# Patient Record
Sex: Female | Born: 1940 | Race: White | Hispanic: No | Marital: Single | Smoking: Never smoker
Health system: Southern US, Community
[De-identification: ages and names within clinical notes are randomized; demographics above are authoritative.]

## PROBLEM LIST (undated history)

## (undated) DIAGNOSIS — I10 Essential (primary) hypertension: Secondary | ICD-10-CM

## (undated) HISTORY — PX: THYROIDECTOMY: SHX17

## (undated) HISTORY — PX: ABDOMINAL HYSTERECTOMY: SHX81

---

## 2019-11-01 ENCOUNTER — Ambulatory Visit: Payer: Self-pay

## 2020-05-20 ENCOUNTER — Other Ambulatory Visit: Payer: Self-pay | Admitting: Family Medicine

## 2020-05-20 DIAGNOSIS — Z13820 Encounter for screening for osteoporosis: Secondary | ICD-10-CM

## 2020-10-12 ENCOUNTER — Inpatient Hospital Stay (HOSPITAL_COMMUNITY): Payer: Medicare HMO | Admitting: Registered Nurse

## 2020-10-12 ENCOUNTER — Inpatient Hospital Stay (HOSPITAL_COMMUNITY): Payer: Medicare HMO

## 2020-10-12 ENCOUNTER — Inpatient Hospital Stay (HOSPITAL_COMMUNITY)
Admission: EM | Admit: 2020-10-12 | Discharge: 2020-10-15 | DRG: 481 | Disposition: A | Discharge status: Skilled Nursing Facility | Payer: Medicare HMO | Attending: Internal Medicine | Admitting: Internal Medicine

## 2020-10-12 ENCOUNTER — Encounter (HOSPITAL_COMMUNITY): Payer: Self-pay

## 2020-10-12 ENCOUNTER — Other Ambulatory Visit: Payer: Self-pay

## 2020-10-12 ENCOUNTER — Emergency Department (HOSPITAL_COMMUNITY): Payer: Medicare HMO

## 2020-10-12 ENCOUNTER — Encounter (HOSPITAL_COMMUNITY)
Admission: EM | Disposition: A | Discharge status: Skilled Nursing Facility | Payer: Self-pay | Source: Home / Self Care | Attending: Internal Medicine

## 2020-10-12 DIAGNOSIS — Z20822 Contact with and (suspected) exposure to covid-19: Secondary | ICD-10-CM | POA: Diagnosis present

## 2020-10-12 DIAGNOSIS — R0789 Other chest pain: Secondary | ICD-10-CM | POA: Diagnosis present

## 2020-10-12 DIAGNOSIS — E785 Hyperlipidemia, unspecified: Secondary | ICD-10-CM | POA: Diagnosis not present

## 2020-10-12 DIAGNOSIS — W19XXXA Unspecified fall, initial encounter: Secondary | ICD-10-CM

## 2020-10-12 DIAGNOSIS — I160 Hypertensive urgency: Secondary | ICD-10-CM | POA: Diagnosis present

## 2020-10-12 DIAGNOSIS — F419 Anxiety disorder, unspecified: Secondary | ICD-10-CM

## 2020-10-12 DIAGNOSIS — S72142A Displaced intertrochanteric fracture of left femur, initial encounter for closed fracture: Secondary | ICD-10-CM | POA: Diagnosis present

## 2020-10-12 DIAGNOSIS — E890 Postprocedural hypothyroidism: Secondary | ICD-10-CM | POA: Diagnosis present

## 2020-10-12 DIAGNOSIS — S72009A Fracture of unspecified part of neck of unspecified femur, initial encounter for closed fracture: Secondary | ICD-10-CM

## 2020-10-12 DIAGNOSIS — I10 Essential (primary) hypertension: Secondary | ICD-10-CM | POA: Diagnosis present

## 2020-10-12 DIAGNOSIS — W010XXA Fall on same level from slipping, tripping and stumbling without subsequent striking against object, initial encounter: Secondary | ICD-10-CM | POA: Diagnosis present

## 2020-10-12 DIAGNOSIS — D62 Acute posthemorrhagic anemia: Secondary | ICD-10-CM | POA: Diagnosis not present

## 2020-10-12 DIAGNOSIS — S72002A Fracture of unspecified part of neck of left femur, initial encounter for closed fracture: Secondary | ICD-10-CM | POA: Diagnosis not present

## 2020-10-12 HISTORY — DX: Essential (primary) hypertension: I10

## 2020-10-12 HISTORY — PX: FEMUR IM NAIL: SHX1597

## 2020-10-12 LAB — BASIC METABOLIC PANEL
Anion gap: 9 (ref 5–15)
BUN: 14 mg/dL (ref 8–23)
CO2: 24 mmol/L (ref 22–32)
Calcium: 9.1 mg/dL (ref 8.9–10.3)
Chloride: 105 mmol/L (ref 98–111)
Creatinine, Ser: 0.65 mg/dL (ref 0.44–1.00)
GFR, Estimated: >60 mL/min (ref >60)
Glucose, Bld: 173 mg/dL — ABNORMAL HIGH (ref 70–99)
Potassium: 3.9 mmol/L (ref 3.5–5.1)
Sodium: 138 mmol/L (ref 135–145)

## 2020-10-12 LAB — CBC WITH DIFFERENTIAL/PLATELET
Abs Immature Granulocytes: 0.02 10*3/uL (ref 0.00–0.07)
Basophils Absolute: 0.1 10*3/uL (ref 0.0–0.1)
Basophils Relative: 1 %
Eosinophils Absolute: 0 10*3/uL (ref 0.0–0.5)
Eosinophils Relative: 0 %
HCT: 41.9 % (ref 36.0–46.0)
Hemoglobin: 13.5 g/dL (ref 12.0–15.0)
Immature Granulocytes: 0 %
Lymphocytes Relative: 14 %
Lymphs Abs: 1.1 10*3/uL (ref 0.7–4.0)
MCH: 31.4 pg (ref 26.0–34.0)
MCHC: 32.2 g/dL (ref 30.0–36.0)
MCV: 97.4 fL (ref 80.0–100.0)
Monocytes Absolute: 0.3 10*3/uL (ref 0.1–1.0)
Monocytes Relative: 4 %
Neutro Abs: 6.8 10*3/uL (ref 1.7–7.7)
Neutrophils Relative %: 81 %
Platelets: 230 10*3/uL (ref 150–400)
RBC: 4.3 MIL/uL (ref 3.87–5.11)
RDW: 11.9 % (ref 11.5–15.5)
WBC: 8.3 10*3/uL (ref 4.0–10.5)
nRBC: 0 % (ref 0.0–0.2)

## 2020-10-12 LAB — RESPIRATORY PANEL BY RT PCR (FLU A&B, COVID)
Influenza A by PCR: NEGATIVE
Influenza B by PCR: NEGATIVE
SARS Coronavirus 2 by RT PCR: NEGATIVE

## 2020-10-12 LAB — TYPE AND SCREEN
ABO/RH(D): B NEG
Antibody Screen: NEGATIVE

## 2020-10-12 LAB — PROTIME-INR
INR: 1 (ref 0.8–1.2)
Prothrombin Time: 12.7 seconds (ref 11.4–15.2)

## 2020-10-12 LAB — ABO/RH: ABO/RH(D): B NEG

## 2020-10-12 LAB — MRSA PCR SCREENING: MRSA by PCR: NEGATIVE

## 2020-10-12 SURGERY — INSERTION, INTRAMEDULLARY ROD, FEMUR
Anesthesia: General | Site: Hip | Laterality: Left

## 2020-10-12 MED ORDER — DOCUSATE SODIUM 100 MG PO CAPS
100.0000 mg | ORAL_CAPSULE | Freq: Two times a day (BID) | ORAL | Status: DC
  Administered 2020-10-12 – 2020-10-15 (×6): 100 mg via ORAL
  Filled 2020-10-12 (×6): qty 1

## 2020-10-12 MED ORDER — ROCURONIUM BROMIDE 10 MG/ML (PF) SYRINGE
PREFILLED_SYRINGE | INTRAVENOUS | Status: AC
  Filled 2020-10-12: qty 10

## 2020-10-12 MED ORDER — SERTRALINE HCL 50 MG PO TABS: 50.0000 mg | ORAL_TABLET | Freq: Every day | ORAL | Status: DC

## 2020-10-12 MED ORDER — ONDANSETRON HCL 4 MG/2ML IJ SOLN
INTRAMUSCULAR | Status: AC
  Filled 2020-10-12: qty 2

## 2020-10-12 MED ORDER — SERTRALINE HCL 50 MG PO TABS
50.0000 mg | ORAL_TABLET | Freq: Every day | ORAL | Status: DC
  Administered 2020-10-13 – 2020-10-14 (×2): 50 mg via ORAL
  Filled 2020-10-12 (×2): qty 1

## 2020-10-12 MED ORDER — CEFAZOLIN SODIUM-DEXTROSE 2-4 GM/100ML-% IV SOLN
2.0000 g | Freq: Four times a day (QID) | INTRAVENOUS | Status: AC
  Administered 2020-10-12 – 2020-10-13 (×3): 2 g via INTRAVENOUS
  Filled 2020-10-12 (×3): qty 100

## 2020-10-12 MED ORDER — METOPROLOL TARTRATE 5 MG/5ML IV SOLN: 5.0000 mg | Freq: Three times a day (TID) | INTRAVENOUS | Status: DC | PRN

## 2020-10-12 MED ORDER — DEXAMETHASONE SODIUM PHOSPHATE 10 MG/ML IJ SOLN
INTRAMUSCULAR | Status: DC | PRN
  Administered 2020-10-12: 6 mg via INTRAVENOUS

## 2020-10-12 MED ORDER — AMLODIPINE BESYLATE 5 MG PO TABS: 5.0000 mg | ORAL_TABLET | Freq: Two times a day (BID) | ORAL | Status: DC

## 2020-10-12 MED ORDER — ACETAMINOPHEN 10 MG/ML IV SOLN
INTRAVENOUS | Status: DC | PRN
  Administered 2020-10-12: 1000 mg via INTRAVENOUS

## 2020-10-12 MED ORDER — SUCCINYLCHOLINE CHLORIDE 200 MG/10ML IV SOSY
PREFILLED_SYRINGE | INTRAVENOUS | Status: DC | PRN
  Administered 2020-10-12: 120 mg via INTRAVENOUS

## 2020-10-12 MED ORDER — LABETALOL HCL 5 MG/ML IV SOLN
INTRAVENOUS | Status: DC | PRN
  Administered 2020-10-12 (×2): 2.5 mg via INTRAVENOUS

## 2020-10-12 MED ORDER — SODIUM CHLORIDE 0.9 % IV SOLN: INTRAVENOUS | Status: DC

## 2020-10-12 MED ORDER — DEXAMETHASONE SODIUM PHOSPHATE 10 MG/ML IJ SOLN
INTRAMUSCULAR | Status: AC
  Filled 2020-10-12: qty 1

## 2020-10-12 MED ORDER — ALBUMIN HUMAN 5 % IV SOLN: INTRAVENOUS | Status: DC | PRN

## 2020-10-12 MED ORDER — METHOCARBAMOL 1000 MG/10ML IJ SOLN
500.0000 mg | Freq: Four times a day (QID) | INTRAVENOUS | Status: DC | PRN
  Filled 2020-10-12: qty 5

## 2020-10-12 MED ORDER — PROPOFOL 1000 MG/100ML IV EMUL
INTRAVENOUS | Status: AC
  Filled 2020-10-12: qty 100

## 2020-10-12 MED ORDER — AMLODIPINE BESYLATE 5 MG PO TABS
5.0000 mg | ORAL_TABLET | Freq: Two times a day (BID) | ORAL | Status: DC
  Administered 2020-10-13 – 2020-10-15 (×5): 5 mg via ORAL
  Filled 2020-10-12 (×5): qty 1

## 2020-10-12 MED ORDER — ENOXAPARIN SODIUM 40 MG/0.4ML ~~LOC~~ SOLN: 40.0000 mg | Freq: Every day | SUBCUTANEOUS | 0 refills | Status: DC

## 2020-10-12 MED ORDER — FENTANYL CITRATE (PF) 100 MCG/2ML IJ SOLN
INTRAMUSCULAR | Status: DC | PRN
  Administered 2020-10-12: 50 ug via INTRAVENOUS
  Administered 2020-10-12: 100 ug via INTRAVENOUS
  Administered 2020-10-12 (×2): 50 ug via INTRAVENOUS

## 2020-10-12 MED ORDER — ACETAMINOPHEN 10 MG/ML IV SOLN
INTRAVENOUS | Status: AC
  Filled 2020-10-12: qty 100

## 2020-10-12 MED ORDER — FENTANYL CITRATE (PF) 100 MCG/2ML IJ SOLN
50.0000 ug | Freq: Once | INTRAMUSCULAR | Status: DC
  Filled 2020-10-12: qty 2

## 2020-10-12 MED ORDER — OXYCODONE HCL 5 MG PO TABS
5.0000 mg | ORAL_TABLET | ORAL | Status: DC | PRN
  Administered 2020-10-13: 5 mg via ORAL
  Administered 2020-10-14 – 2020-10-15 (×2): 10 mg via ORAL
  Filled 2020-10-12 (×4): qty 2
  Filled 2020-10-12: qty 1
  Filled 2020-10-12 (×2): qty 2

## 2020-10-12 MED ORDER — CEFAZOLIN SODIUM-DEXTROSE 2-4 GM/100ML-% IV SOLN
2.0000 g | INTRAVENOUS | Status: AC
  Administered 2020-10-12: 2 g via INTRAVENOUS

## 2020-10-12 MED ORDER — SUGAMMADEX SODIUM 200 MG/2ML IV SOLN
INTRAVENOUS | Status: DC | PRN
  Administered 2020-10-12: 200 mg via INTRAVENOUS

## 2020-10-12 MED ORDER — EPHEDRINE 5 MG/ML INJ
INTRAVENOUS | Status: AC
  Filled 2020-10-12: qty 10

## 2020-10-12 MED ORDER — FENTANYL CITRATE (PF) 100 MCG/2ML IJ SOLN
50.0000 ug | INTRAMUSCULAR | Status: AC | PRN
  Administered 2020-10-12 (×2): 50 ug via INTRAVENOUS
  Filled 2020-10-12: qty 2

## 2020-10-12 MED ORDER — HYDROCODONE-ACETAMINOPHEN 5-325 MG PO TABS: 1.0000 | ORAL_TABLET | Freq: Four times a day (QID) | ORAL | Status: DC | PRN

## 2020-10-12 MED ORDER — METHOCARBAMOL 500 MG PO TABS
500.0000 mg | ORAL_TABLET | Freq: Four times a day (QID) | ORAL | Status: DC | PRN
  Administered 2020-10-12 – 2020-10-13 (×5): 500 mg via ORAL
  Filled 2020-10-12 (×5): qty 1

## 2020-10-12 MED ORDER — ONDANSETRON HCL 4 MG/2ML IJ SOLN
4.0000 mg | Freq: Three times a day (TID) | INTRAMUSCULAR | Status: DC | PRN
  Administered 2020-10-12: 4 mg via INTRAVENOUS
  Filled 2020-10-12: qty 2

## 2020-10-12 MED ORDER — PHENOL 1.4 % MT LIQD: 1.0000 | OROMUCOSAL | Status: DC | PRN

## 2020-10-12 MED ORDER — SUCCINYLCHOLINE CHLORIDE 200 MG/10ML IV SOSY
PREFILLED_SYRINGE | INTRAVENOUS | Status: AC
  Filled 2020-10-12: qty 10

## 2020-10-12 MED ORDER — TRANEXAMIC ACID-NACL 1000-0.7 MG/100ML-% IV SOLN
1000.0000 mg | INTRAVENOUS | Status: AC
  Administered 2020-10-12: 1000 mg via INTRAVENOUS

## 2020-10-12 MED ORDER — FENTANYL CITRATE (PF) 250 MCG/5ML IJ SOLN
INTRAMUSCULAR | Status: AC
  Filled 2020-10-12: qty 5

## 2020-10-12 MED ORDER — TRANEXAMIC ACID-NACL 1000-0.7 MG/100ML-% IV SOLN
INTRAVENOUS | Status: AC
  Filled 2020-10-12: qty 100

## 2020-10-12 MED ORDER — OXYCODONE-ACETAMINOPHEN 5-325 MG PO TABS: 1.0000 | ORAL_TABLET | Freq: Three times a day (TID) | ORAL | 0 refills | Status: DC | PRN

## 2020-10-12 MED ORDER — ATENOLOL 25 MG PO TABS: 25.0000 mg | ORAL_TABLET | Freq: Every day | ORAL | Status: DC

## 2020-10-12 MED ORDER — ENOXAPARIN SODIUM 40 MG/0.4ML ~~LOC~~ SOLN: 40.0000 mg | SUBCUTANEOUS | Status: DC

## 2020-10-12 MED ORDER — 0.9 % SODIUM CHLORIDE (POUR BTL) OPTIME
TOPICAL | Status: DC | PRN
  Administered 2020-10-12: 1000 mL

## 2020-10-12 MED ORDER — ATENOLOL 25 MG PO TABS
25.0000 mg | ORAL_TABLET | Freq: Every day | ORAL | Status: DC
  Administered 2020-10-13 – 2020-10-14 (×2): 25 mg via ORAL
  Filled 2020-10-12 (×2): qty 1

## 2020-10-12 MED ORDER — ONDANSETRON HCL 4 MG PO TABS: 4.0000 mg | ORAL_TABLET | Freq: Four times a day (QID) | ORAL | Status: DC | PRN

## 2020-10-12 MED ORDER — HYDROMORPHONE HCL 1 MG/ML IJ SOLN: 0.5000 mg | INTRAMUSCULAR | Status: DC | PRN

## 2020-10-12 MED ORDER — OXYCODONE HCL 5 MG PO TABS
10.0000 mg | ORAL_TABLET | ORAL | Status: DC | PRN
  Administered 2020-10-12 – 2020-10-14 (×5): 10 mg via ORAL
  Filled 2020-10-12: qty 2

## 2020-10-12 MED ORDER — ALUM & MAG HYDROXIDE-SIMETH 200-200-20 MG/5ML PO SUSP: 30.0000 mL | ORAL | Status: DC | PRN

## 2020-10-12 MED ORDER — ONE-DAILY MULTI VITAMINS PO TABS: 1.0000 | ORAL_TABLET | Freq: Every day | ORAL | Status: DC

## 2020-10-12 MED ORDER — ROCURONIUM BROMIDE 10 MG/ML (PF) SYRINGE
PREFILLED_SYRINGE | INTRAVENOUS | Status: DC | PRN
  Administered 2020-10-12: 30 mg via INTRAVENOUS

## 2020-10-12 MED ORDER — POLYETHYLENE GLYCOL 3350 17 G PO PACK: 17.0000 g | PACK | Freq: Every day | ORAL | Status: DC | PRN

## 2020-10-12 MED ORDER — CEFAZOLIN SODIUM-DEXTROSE 2-4 GM/100ML-% IV SOLN
INTRAVENOUS | Status: AC
  Filled 2020-10-12: qty 100

## 2020-10-12 MED ORDER — MENTHOL 3 MG MT LOZG: 1.0000 | LOZENGE | OROMUCOSAL | Status: DC | PRN

## 2020-10-12 MED ORDER — TRANEXAMIC ACID 1000 MG/10ML IV SOLN
2000.0000 mg | INTRAVENOUS | Status: DC
  Filled 2020-10-12: qty 20

## 2020-10-12 MED ORDER — ENOXAPARIN SODIUM 40 MG/0.4ML ~~LOC~~ SOLN
40.0000 mg | SUBCUTANEOUS | Status: DC
  Administered 2020-10-13 – 2020-10-15 (×3): 40 mg via SUBCUTANEOUS
  Filled 2020-10-12 (×3): qty 0.4

## 2020-10-12 MED ORDER — PROPOFOL 10 MG/ML IV BOLUS
INTRAVENOUS | Status: AC
  Filled 2020-10-12: qty 20

## 2020-10-12 MED ORDER — LIDOCAINE 2% (20 MG/ML) 5 ML SYRINGE
INTRAMUSCULAR | Status: AC
  Filled 2020-10-12: qty 5

## 2020-10-12 MED ORDER — LORAZEPAM 2 MG/ML IJ SOLN
0.5000 mg | Freq: Once | INTRAMUSCULAR | Status: AC
  Administered 2020-10-12: 0.5 mg via INTRAVENOUS
  Filled 2020-10-12: qty 1

## 2020-10-12 MED ORDER — LORAZEPAM 2 MG/ML IJ SOLN: 0.5000 mg | Freq: Two times a day (BID) | INTRAMUSCULAR | Status: DC | PRN

## 2020-10-12 MED ORDER — MIDAZOLAM HCL 2 MG/2ML IJ SOLN
INTRAMUSCULAR | Status: AC
  Filled 2020-10-12: qty 2

## 2020-10-12 MED ORDER — ACETAMINOPHEN 325 MG PO TABS
325.0000 mg | ORAL_TABLET | Freq: Four times a day (QID) | ORAL | Status: DC | PRN
  Administered 2020-10-13: 650 mg via ORAL
  Filled 2020-10-12: qty 2

## 2020-10-12 MED ORDER — LIDOCAINE 2% (20 MG/ML) 5 ML SYRINGE
INTRAMUSCULAR | Status: DC | PRN
  Administered 2020-10-12: 75 mg via INTRAVENOUS

## 2020-10-12 MED ORDER — SORBITOL 70 % SOLN
30.0000 mL | Freq: Every day | Status: DC | PRN
  Filled 2020-10-12: qty 30

## 2020-10-12 MED ORDER — ACETAMINOPHEN 500 MG PO TABS
1000.0000 mg | ORAL_TABLET | Freq: Four times a day (QID) | ORAL | Status: AC
  Administered 2020-10-12 – 2020-10-13 (×3): 1000 mg via ORAL
  Filled 2020-10-12 (×3): qty 2

## 2020-10-12 MED ORDER — ONDANSETRON HCL 4 MG/2ML IJ SOLN
INTRAMUSCULAR | Status: DC | PRN
  Administered 2020-10-12: 4 mg via INTRAVENOUS

## 2020-10-12 MED ORDER — MORPHINE SULFATE (PF) 2 MG/ML IV SOLN
0.5000 mg | INTRAVENOUS | Status: DC | PRN
  Administered 2020-10-12 (×2): 0.5 mg via INTRAVENOUS
  Filled 2020-10-12 (×2): qty 1

## 2020-10-12 MED ORDER — LACTATED RINGERS IV SOLN: INTRAVENOUS | Status: DC | PRN

## 2020-10-12 MED ORDER — MAGNESIUM CITRATE PO SOLN: 1.0000 | Freq: Once | ORAL | Status: DC | PRN

## 2020-10-12 MED ORDER — ONDANSETRON HCL 4 MG/2ML IJ SOLN: 4.0000 mg | Freq: Four times a day (QID) | INTRAMUSCULAR | Status: DC | PRN

## 2020-10-12 MED ORDER — POVIDONE-IODINE 10 % EX SWAB: 2.0000 "application " | Freq: Once | CUTANEOUS | Status: DC

## 2020-10-12 MED ORDER — PROPOFOL 10 MG/ML IV BOLUS
INTRAVENOUS | Status: DC | PRN
  Administered 2020-10-12: 90 mg via INTRAVENOUS
  Administered 2020-10-12: 30 mg via INTRAVENOUS

## 2020-10-12 MED ORDER — PHENYLEPHRINE HCL (PRESSORS) 10 MG/ML IV SOLN
INTRAVENOUS | Status: AC
  Filled 2020-10-12: qty 1

## 2020-10-12 SURGICAL SUPPLY — 39 items
BIT DRILL INTERTAN LAG SCREW (BIT) ×2 IMPLANT
BIT DRILL SHORT 4.0 (BIT) ×1 IMPLANT
BNDG COHESIVE 4X5 TAN STRL (GAUZE/BANDAGES/DRESSINGS) ×2 IMPLANT
BNDG COHESIVE 6X5 TAN STRL LF (GAUZE/BANDAGES/DRESSINGS) ×2 IMPLANT
BNDG ELASTIC 6X5.8 VLCR STR LF (GAUZE/BANDAGES/DRESSINGS) ×4 IMPLANT
COVER SURGICAL LIGHT HANDLE (MISCELLANEOUS) ×2 IMPLANT
COVER WAND RF STERILE (DRAPES) IMPLANT
DRAPE C-ARM 42X120 X-RAY (DRAPES) ×2 IMPLANT
DRAPE U-SHAPE 47X51 STRL (DRAPES) ×2 IMPLANT
DRILL BIT SHORT 4.0 (BIT) ×1
DRSG AQUACEL AG ADV 3.5X 4 (GAUZE/BANDAGES/DRESSINGS) ×4 IMPLANT
DRSG MEPILEX BORDER 4X4 (GAUZE/BANDAGES/DRESSINGS) ×6 IMPLANT
DURAPREP 26ML APPLICATOR (WOUND CARE) ×2 IMPLANT
ELECT REM PT RETURN 15FT ADLT (MISCELLANEOUS) ×2 IMPLANT
FACESHIELD WRAPAROUND (MASK) ×4 IMPLANT
GLOVE BIOGEL PI IND STRL 7.5 (GLOVE) ×2 IMPLANT
GLOVE BIOGEL PI INDICATOR 7.5 (GLOVE) ×2
GLOVE SURG SS PI 7.5 STRL IVOR (GLOVE) ×4 IMPLANT
GOWN STRL REUS W/TWL XL LVL3 (GOWN DISPOSABLE) ×2 IMPLANT
GUIDE PIN 3.2X343 (PIN) ×3
GUIDE PIN 3.2X343MM (PIN) ×3
KIT BASIN OR (CUSTOM PROCEDURE TRAY) ×2 IMPLANT
KIT TURNOVER KIT A (KITS) IMPLANT
MANIFOLD NEPTUNE II (INSTRUMENTS) ×2 IMPLANT
NAIL TRIGEN LEFT 10X38-125 (Nail) ×2 IMPLANT
NS IRRIG 1000ML POUR BTL (IV SOLUTION) ×2 IMPLANT
PACK GENERAL/GYN (CUSTOM PROCEDURE TRAY) ×2 IMPLANT
PADDING CAST COTTON 6X4 STRL (CAST SUPPLIES) ×2 IMPLANT
PENCIL SMOKE EVACUATOR (MISCELLANEOUS) IMPLANT
PIN GUIDE 3.2X343MM (PIN) ×3 IMPLANT
PROTECTOR NERVE ULNAR (MISCELLANEOUS) ×4 IMPLANT
SCREW LAG COMPR KIT 80/75 (Screw) ×2 IMPLANT
SCREW TRIGEN LOW PROF 5.0X42.5 (Screw) ×2 IMPLANT
STAPLER VISISTAT 35W (STAPLE) IMPLANT
STOCKINETTE 8 INCH (MISCELLANEOUS) ×2 IMPLANT
SUT VIC AB 1 CT1 36 (SUTURE) ×4 IMPLANT
SUT VIC AB 2-0 CT1 27 (SUTURE) ×2
SUT VIC AB 2-0 CT1 TAPERPNT 27 (SUTURE) ×2 IMPLANT
TOWEL OR 17X26 10 PK STRL BLUE (TOWEL DISPOSABLE) ×2 IMPLANT

## 2020-10-12 NOTE — Progress Notes (Signed)
Pt stable on arrival to floor. Pt nausea and vomiting remains with movement. Continues to report pain as well while at rest. VS stable though bp elevated. Assessment performed. Rn will continue to monitor.

## 2020-10-12 NOTE — Transfer of Care (Signed)
Immediate Anesthesia Transfer of Care Note  Patient: April Tanner  Procedure(s) Performed: INTRAMEDULLARY (IM) NAIL FEMORAL (Left Hip)  Patient Location: PACU  Anesthesia Type:General  Level of Consciousness: awake, alert  and patient cooperative  Airway & Oxygen Therapy: Patient Spontanous Breathing and Patient connected to face mask oxygen  Post-op Assessment: Report given to RN, Post -op Vital signs reviewed and stable and Patient moving all extremities X 4  Post vital signs: stable  Last Vitals:  Vitals Value Taken Time  BP    Temp    Pulse 70 10/12/20 1639  Resp 21 10/12/20 1639  SpO2 100 % 10/12/20 1639  Vitals shown include unvalidated device data.  Last Pain:  Vitals:   10/12/20 1346  TempSrc:   PainSc: 3       Patients Stated Pain Goal: 3 (10/12/20 1254)  Complications: No complications documented.

## 2020-10-12 NOTE — Progress Notes (Signed)
Pt to pacu in stable condition. No needs at time of transfer.  

## 2020-10-12 NOTE — ED Triage Notes (Signed)
Patient BIB [facility] after falling on the sidewalk over the curb. She did not hit her head, or patient does not take a blood thinner. Patient fell on her left hip. Patient vomited multiple times in EMS. of Fetenyl given by EMS. Patient states she has urinary incontinence. 20G IV in left forearm placed by EMS.

## 2020-10-12 NOTE — ED Provider Notes (Signed)
[facility]-EMERGENCY DEPT Provider Note   CSN: [id number] Arrival date & time: 10/12/20  6606     History Chief Complaint  Patient presents with  . Fall    April Tanner is a 79 y.o. female.  79 year old woman presents after fall on left hip.  Patient was walking her dog to [employer] for procedure this morning, tripped on the curb landed on her hands and knees, as well as left hip.  She had significant pain when trying to get up and pain is persisted, she was unable to get up unassisted.  She had no loss of consciousness, did not enter head, no abrasions or lacerations.  Past medical history significant for hypertension, hyperlipidemia.  Patient takes amlodipine, atenolol, Zoloft, does not use aspirin or any blood thinners.  Left foot is warm to touch, able to wiggle toes, reports severe pain.        Past Medical History:  Diagnosis Date  . Hypertension     There are no problems to display for this patient.   Past Surgical History:  Procedure Laterality Date  . ABDOMINAL HYSTERECTOMY    . THYROIDECTOMY       OB History   No obstetric history on file.     No family history on file.  Social History   Tobacco Use  . Smoking status: Never Smoker  . Smokeless tobacco: Never Used  Vaping Use  . Vaping Use: Never used  Substance Use Topics  . Alcohol use: Yes    Comment: social drinker  . Drug use: Never    Home Medications Prior to Admission medications   Medication Sig Start Date End Date Taking? Authorizing Provider  acetaminophen (TYLENOL) 650 MG CR tablet Take 650 mg by mouth every 8 (eight) hours as needed for pain.   Yes [provider]  amLODipine (NORVASC) 5 MG tablet Take 5 mg by mouth 2 (two) times daily. 08/15/20  Yes [provider]  atenolol (TENORMIN) 25 MG tablet Take 25 mg by mouth at bedtime.  08/15/20  Yes [provider]  Multiple Vitamin (MULTIVITAMIN) tablet Take 1 tablet by mouth daily.    Yes [provider]  sertraline (ZOLOFT) 50 MG tablet Take 50 mg by mouth at bedtime.  08/15/20  Yes [provider]    Allergies    Patient has no known allergies.  Review of Systems   Review of Systems  Constitutional: Negative for chills and fever.  Respiratory: Negative for cough.   Cardiovascular: Negative for chest pain.  Musculoskeletal: Positive for myalgias.  Skin: Negative.   Neurological: Negative for dizziness, weakness, numbness and headaches.  All other systems reviewed and are negative.   Physical Exam Updated Vital Signs BP (!) 170/82   Pulse 70   Temp (!) 97.4 F (36.3 C) (Oral)   Resp 18   Ht 5\' 5"  (1.651 m)   Wt 83 kg   SpO2 91%   BMI 30.45 kg/m   Physical Exam Vitals and nursing note reviewed.  Constitutional:      General: She is in acute distress.     Appearance: Normal appearance. She is obese. She is not ill-appearing, toxic-appearing or diaphoretic.     Comments: Very uncomfortable appearing woman lying on right side still on EMS stretcher  HENT:     Head: Normocephalic and atraumatic.     Nose: Nose normal. No congestion or rhinorrhea.     Mouth/Throat:     Mouth: Mucous membranes are moist.  Pharynx: Oropharynx is clear. No oropharyngeal exudate or posterior oropharyngeal erythema.  Eyes:     Extraocular Movements: Extraocular movements intact.     Conjunctiva/sclera: Conjunctivae normal.     Pupils: Pupils are equal, round, and reactive to light.  Cardiovascular:     Rate and Rhythm: Normal rate and regular rhythm.     Pulses: Normal pulses.     Heart sounds: Normal heart sounds.  Pulmonary:     Effort: Pulmonary effort is normal.     Breath sounds: Normal breath sounds.  Musculoskeletal:        General: Tenderness present.     Cervical back: Neck supple.     Right lower leg: No edema.     Left lower leg: No edema.     Comments: LLE: Exam limited by pain, neurovascularly intact on left, no externally rotated,  DP2+ on left, leg/foot warm to touch, ROM severely limited 2/2 pain  Skin:    General: Skin is warm and dry.     Comments: No bruising or abrasions on hands or knees  Neurological:     General: No focal deficit present.     Mental Status: She is alert. Mental status is at baseline.     Cranial Nerves: No cranial nerve deficit.     Sensory: No sensory deficit.     Deep Tendon Reflexes: Reflexes normal.  Psychiatric:        Mood and Affect: Mood normal.        Behavior: Behavior normal.     ED Results / Procedures / Treatments   Labs (all labs ordered are listed, but only abnormal results are displayed) Labs Reviewed  BASIC METABOLIC PANEL - Abnormal; Notable for the following components:      Result Value   Glucose, Bld 173 (*)    All other components within normal limits  CBC WITH DIFFERENTIAL/PLATELET  PROTIME-INR  TYPE AND SCREEN  ABO/RH    EKG None  Radiology DG Hip Unilat With Pelvis 2-3 Views Left  Result Date: 10/12/2020 CLINICAL DATA:  Pain after fall EXAM: DG HIP (WITH OR WITHOUT PELVIS) 2-3V LEFT COMPARISON:  None. FINDINGS: There is a comminuted displaced fracture in the proximal femur extending through the intertrochanteric region. No dislocation identified. IMPRESSION: Comminuted displaced intertrochanteric left hip fracture. Electronically Signed   By: Gerome Sam III M.D   On: 10/12/2020 10:30   DG Femur 1V Left  Result Date: 10/12/2020 CLINICAL DATA:  Larey Seat on left hip.  Pain. EXAM: LEFT FEMUR 1 VIEW COMPARISON:  None. FINDINGS: A single AP view of the distal half of the femur was obtained. No fractures noted in this region. IMPRESSION: No fracture seen in the distal half of the left femur on a single limited AP view. Electronically Signed   By: Gerome Sam III M.D   On: 10/12/2020 10:29    Procedures Procedures (including critical care time)  Medications Ordered in ED Medications  fentaNYL (SUBLIMAZE) injection 50 mcg (50 mcg Intravenous Given  10/12/20 1031)  LORazepam (ATIVAN) injection 0.5 mg (0.5 mg Intravenous Given 10/12/20 1027)    ED Course  I have reviewed the triage vital signs and the nursing notes.  Pertinent labs & imaging results that were available during my care of the patient were reviewed by me and considered in my medical decision making (see chart for details).  Clinical Course as of Oct 13 1123  Sat Oct 12, 2020  1058 X-ray reveals comminuted displaced intertrochanteric left hip fracture. Will  inform patient, consult orthopedics, and admit to medicine.   [CM]    Clinical Course User Index [CM] Shirlean Mylar, MD   MDM Rules/Calculators/A&P                          79 yo woman presenting after a fall with severe pain to left hip.  Will treat pain with IV fentanyl, will obtain x-rays of left hip to rule out fracture.  Patient did not hit head, no loss of consciousness, not on blood thinners, no reason for head imaging at this time.  Will obtain preop work-up including chest x-ray, EKG, CBC, CMP, will remain n.p.o. pending further work-up.  Discussed hip fracture with patient, as well as granddaughter Maralyn Sago and daughter Barkley Bruns via phone. Awaiting consult from ortho. Discussed with hospitalist who will admit.  Spoke with Dr. Roda Shutters, orthopedist. Patient is admitted to [facility], Dr. Roda Shutters will schedule her for surgery.  Final Clinical Impression(s) / ED Diagnoses Final diagnoses:  Fall, initial encounter  Closed fracture of left hip, initial encounter [facility])    Rx / DC Orders ED Discharge Orders    None       Shirlean Mylar, MD 10/12/20 1314    Gwyneth Sprout, MD 10/12/20 1948

## 2020-10-12 NOTE — Progress Notes (Signed)
Pt back from pacu in stable condition. No needs at time of arrival to floor. Pt surgical dressings dry and intact.

## 2020-10-12 NOTE — Discharge Instructions (Signed)
° ° °  1. Change dressings as needed °2. May shower but keep incisions covered and dry °3. Take lovenox to prevent blood clots °4. Take stool softeners as needed °5. Take pain meds as needed ° °

## 2020-10-12 NOTE — Op Note (Signed)
   Date of Surgery: 10/12/2020  INDICATIONS: April Tanner is a 79 y.o.-year-old female who sustained a left hip fracture. The risks and benefits of the procedure discussed with the patient prior to the procedure and all questions were answered; consent was obtained.  PREOPERATIVE DIAGNOSIS: left intertrochanteric fracture   POSTOPERATIVE DIAGNOSIS: Same   PROCEDURE: Open treatment of intertrochanteric fracture with intramedullary implant. CPT [phone]   SURGEON: N. Glee Arvin, M.D.   ASSIST: Starlyn Skeans [place], [place]; necessary for the timely completion of procedure and due to complexity of procedure.  ANESTHESIA: general   IV FLUIDS AND URINE: See anesthesia record   ESTIMATED BLOOD LOSS: 150 cc  IMPLANTS: [employer] InterTAN 10 x 38, 80/75 lag screws  DRAINS: None.   COMPLICATIONS: see description of procedure.   DESCRIPTION OF PROCEDURE: The patient was brought to the operating room and placed supine on the operating table. The patient's leg had been signed prior to the procedure. The patient had the anesthesia placed by the anesthesiologist. The prep verification and incision time-outs were performed to confirm that this was the correct patient, site, side and location. The patient had an SCD on the opposite lower extremity. The patient did receive antibiotics prior to the incision and was re-dosed during the procedure as needed at indicated intervals. The patient was positioned on the fracture table with the table in traction and internal rotation to reduce the hip. The well leg was placed in a scissor position and all bony prominences were well-padded. The patient had the lower extremity prepped and draped in the standard surgical fashion. The incision was made 4 finger breadths superior to the greater trochanter. A guide pin was inserted into the tip of the greater trochanter under fluoroscopic guidance. An opening reamer was used to gain access to the femoral canal. The nail  length was measured and inserted down the femoral canal to its proper depth. The appropriate version of insertion for the lag screw was found under fluoroscopy. A pin was inserted up the femoral neck through the jig. Then, a second antirotation pin was inserted inferior to the first pin. The length of the lag screw was then measured. The lag screw was inserted as near to center-center in the head as possible. The antirotation pin was then taken out and an interdigitating compression screw was placed in its place. The leg was taken out of traction, then the interdigitating compression screw was used to compress across the fracture. Compression was visualized on serial xrays.  A distal interlocking screw was placed using the perfect circle technique.  The wound was copiously irrigated with saline and the subcutaneous layer closed with 2.0 vicryl and the skin was reapproximated with staples. The wounds were cleaned and dried a final time and a sterile dressing was placed. The hip was taken through a range of motion at the end of the case under fluoroscopic imaging to visualize the approach-withdraw phenomenon and confirm implant length in the head. The patient was then awakened from anesthesia and taken to the recovery room in stable condition. All counts were correct at the end of the case.   POSTOPERATIVE PLAN: The patient will be weight bearing as tolerated and will return in 2 weeks for staple removal and the patient will receive DVT prophylaxis based on other medications, activity level, and risk ratio of bleeding to thrombosis.   April Reel, MD [facility] 4:04 PM

## 2020-10-12 NOTE — Consult Note (Signed)
ORTHOPAEDIC CONSULTATION  REQUESTING PHYSICIAN: Jae Dire, MD  Chief Complaint: Left intertrochanteric fracture  HPI: 79 year old female with past medical history of hypertension, hyperlipidemia who presented to the ED via EMS after falling on the sidewalk when she tripped over a curb while walking her dog earlier today.  Patient fell on her hands and knees as well as left hip and had significant pain when trying to get up.  She was unable to stand up unassisted.  No loss of consciousness, no head trauma, no abrasions or lacerations.  She is not on aspirin or any other blood thinners.  She vomited multiple times as well en route to the ED. Ortho consulted for surgical evaluation.  Past Medical History:  Diagnosis Date  . Hypertension    Past Surgical History:  Procedure Laterality Date  . ABDOMINAL HYSTERECTOMY    . THYROIDECTOMY     Social History   Socioeconomic History  . Marital status: Single    Spouse name: Not on file  . Number of children: Not on file  . Years of education: Not on file  . Highest education level: Not on file  Occupational History  . Not on file  Tobacco Use  . Smoking status: Never Smoker  . Smokeless tobacco: Never Used  Vaping Use  . Vaping Use: Never used  Substance and Sexual Activity  . Alcohol use: Yes    Comment: social drinker  . Drug use: Never  . Sexual activity: Not on file  Other Topics Concern  . Not on file  Social History Narrative  . Not on file   Social Determinants of Health   Financial Resource Strain:   . Difficulty of Paying Living Expenses: Not on file  Food Insecurity:   . Worried About Programme researcher, broadcasting/film/video in the Last Year: Not on file  . Ran Out of Food in the Last Year: Not on file  Transportation Needs:   . Lack of Transportation (Medical): Not on file  . Lack of Transportation (Non-Medical): Not on file  Physical Activity:   . Days of Exercise per Week: Not on file  . Minutes of Exercise per Session:  Not on file  Stress:   . Feeling of Stress : Not on file  Social Connections:   . Frequency of Communication with Friends and Family: Not on file  . Frequency of Social Gatherings with Friends and Family: Not on file  . Attends Religious Services: Not on file  . Active Member of Clubs or Organizations: Not on file  . Attends Banker Meetings: Not on file  . Marital Status: Not on file   History reviewed. No pertinent family history. No Known Allergies Prior to Admission medications   Medication Sig Start Date End Date Taking? Authorizing Provider  acetaminophen (TYLENOL) 650 MG CR tablet Take 650 mg by mouth every 8 (eight) hours as needed for pain.   Yes [provider]  amLODipine (NORVASC) 5 MG tablet Take 5 mg by mouth 2 (two) times daily. 08/15/20  Yes [provider]  atenolol (TENORMIN) 25 MG tablet Take 25 mg by mouth at bedtime.  08/15/20  Yes [provider]  Multiple Vitamin (MULTIVITAMIN) tablet Take 1 tablet by mouth daily.   Yes [provider]  sertraline (ZOLOFT) 50 MG tablet Take 50 mg by mouth at bedtime.  08/15/20  Yes [provider]   DG Hip Unilat With Pelvis 2-3 Views Left  Result Date: 10/12/2020 CLINICAL DATA:  Pain after fall EXAM: DG HIP (WITH OR WITHOUT PELVIS) 2-3V LEFT COMPARISON:  None. FINDINGS: There is a comminuted displaced fracture in the proximal femur extending through the intertrochanteric region. No dislocation identified. IMPRESSION: Comminuted displaced intertrochanteric left hip fracture. Electronically Signed   By: Gerome Sam III M.D   On: 10/12/2020 10:30   DG Femur 1V Left  Result Date: 10/12/2020 CLINICAL DATA:  Larey Seat on left hip.  Pain. EXAM: LEFT FEMUR 1 VIEW COMPARISON:  None. FINDINGS: A single AP view of the distal half of the femur was obtained. No fractures noted in this region. IMPRESSION: No fracture seen in the distal half of the left femur on a single limited AP view.  Electronically Signed   By: Gerome Sam III M.D   On: 10/12/2020 10:29    All pertinent xrays, MRI, CT independently reviewed and interpreted  Positive ROS: All other systems have been reviewed and were otherwise negative with the exception of those mentioned in the HPI and as above.  Physical Exam: General: No acute distress Cardiovascular: No pedal edema Respiratory: No cyanosis, no use of accessory musculature GI: No organomegaly, abdomen is soft and non-tender Skin: No lesions in the area of chief complaint Neurologic: Sensation intact distally Psychiatric: Patient is at baseline mood and affect Lymphatic: No axillary or cervical lymphadenopathy  MUSCULOSKELETAL:  - severe pain with movement of the hip and extremity - skin intact - NVI distally - compartments soft  Assessment: Left intertrochanteric fracture  Plan: - surgical treatment is recommended for pain relief, quality of life and early mobilization - patient aware of r/b/a and wish to proceed, informed consent obtained - medical optimization per primary team - surgery is planned for this afternoon - covid test pending - continue NPO - hold lovenox  Thank you for the consult and the opportunity to see [name] April Arvin, MD [facility] 1:58 PM

## 2020-10-12 NOTE — Anesthesia Preprocedure Evaluation (Signed)
Anesthesia Evaluation    Reviewed: Allergy & Precautions, NPO status , Patient's Chart, lab work & pertinent test results  Airway Mallampati: II  TM Distance: >3 FB     Dental   Pulmonary neg pulmonary ROS,    breath sounds clear to auscultation       Cardiovascular hypertension,  Rhythm:Regular Rate:Normal     Neuro/Psych Anxiety    GI/Hepatic negative GI ROS, Neg liver ROS,   Endo/Other  negative endocrine ROS  Renal/GU negative Renal ROS     Musculoskeletal   Abdominal   Peds  Hematology   Anesthesia Other Findings   Reproductive/Obstetrics                             Anesthesia Physical Anesthesia Plan  ASA: III  Anesthesia Plan: General   Post-op Pain Management:    Induction: Intravenous  PONV Risk Score and Plan: Ondansetron, Dexamethasone and Midazolam  Airway Management Planned: Oral ETT  Additional Equipment:   Intra-op Plan:   Post-operative Plan: Extubation in OR  Informed Consent: I have reviewed the patients History and Physical, chart, labs and discussed the procedure including the risks, benefits and alternatives for the proposed anesthesia with the patient or authorized representative who has indicated his/her understanding and acceptance.     Dental advisory given  Plan Discussed with: CRNA and Anesthesiologist  Anesthesia Plan Comments:         Anesthesia Quick Evaluation

## 2020-10-12 NOTE — H&P (Signed)
History and Physical        Hospital Admission Note Date: 10/12/2020  Patient name: April Tanner Medical record number: [id number] Date of birth: 05/14/1941 Age: 79 y.o. Gender: female  PCP: Verlon Au, MD  Patient coming from: Home Lives with: Daughter At baseline, ambulates: Independently  Chief Complaint    Chief Complaint  Patient presents with  . Fall      HPI:   This is a 79 year old female with past medical history of hypertension, hyperlipidemia who presented to the ED via EMS after falling on the sidewalk when she tripped over a curb while walking her dog earlier today.  Patient fell on her hands and knees as well as left hip and had significant pain when trying to get up.  She was unable to stand up unassisted.  No loss of consciousness, no head trauma, no abrasions or lacerations.  She is not on aspirin or any other blood thinners.  She vomited multiple times as well en route to the ED.  ED Course: Afebrile, hypertensive up to 207/99, currently 157/89 and otherwise stable on room air.  Labs overall unremarkable.  Left hip x-ray: Comminuted displaced intertrochanteric left hip fracture.  She received fentanyl 50 mcg x 2, Ativan 0.5 mg x 1 and had improvement in her blood pressure.  Vitals:   10/12/20 1109 10/12/20 1143  BP: (!) 170/82 (!) 157/89  Pulse: 70 66  Resp: 18 (!) 22  Temp:    SpO2: 91% 91%     Review of Systems:  Review of Systems  Constitutional: Negative for chills and fever.  Gastrointestinal: Positive for vomiting. Negative for abdominal pain.  Musculoskeletal: Positive for falls and joint pain.  Psychiatric/Behavioral:       +Anxiety  All other systems reviewed and are negative.   Medical/Social/Family History   Past Medical History: Past Medical History:  Diagnosis Date  . Hypertension     Past Surgical History:   Procedure Laterality Date  . ABDOMINAL HYSTERECTOMY    . THYROIDECTOMY      Medications: Prior to Admission medications   Medication Sig Start Date End Date Taking? Authorizing Provider  acetaminophen (TYLENOL) 650 MG CR tablet Take 650 mg by mouth every 8 (eight) hours as needed for pain.   Yes [provider]  amLODipine (NORVASC) 5 MG tablet Take 5 mg by mouth 2 (two) times daily. 08/15/20  Yes [provider]  atenolol (TENORMIN) 25 MG tablet Take 25 mg by mouth at bedtime.  08/15/20  Yes [provider]  Multiple Vitamin (MULTIVITAMIN) tablet Take 1 tablet by mouth daily.   Yes [provider]  sertraline (ZOLOFT) 50 MG tablet Take 50 mg by mouth at bedtime.  08/15/20  Yes [provider]    Allergies:  No Known Allergies  Social History:  reports that she has never smoked. She has never used smokeless tobacco. She reports current alcohol use. She reports that she does not use drugs.  Family History: No family history on file.   Objective   Physical Exam: Blood pressure (!) 157/89, pulse 66, temperature (!) 97.4 F (36.3 C), temperature source Oral, resp. rate (!) 22, height 5\' 5"  (1.651 m), weight 83 kg,  SpO2 91 %.  Physical Exam Vitals and nursing note reviewed.  Constitutional:      Appearance: Normal appearance.  HENT:     Head: Normocephalic and atraumatic.  Eyes:     Conjunctiva/sclera: Conjunctivae normal.  Cardiovascular:     Rate and Rhythm: Normal rate and regular rhythm.  Pulmonary:     Effort: Pulmonary effort is normal.     Breath sounds: Normal breath sounds.  Abdominal:     General: Abdomen is flat.     Palpations: Abdomen is soft.  Musculoskeletal:     Comments: Pulses intact Left hip with tenderness  Skin:    Coloration: Skin is not jaundiced or pale.  Neurological:     Mental Status: She is alert. Mental status is at baseline.  Psychiatric:        Mood and Affect: Mood normal.        Behavior:  Behavior normal.     LABS on Admission: I have personally reviewed all the labs and imaging below    Basic Metabolic Panel: Recent Labs  Lab 10/12/20 0844  NA 138  K 3.9  CL 105  CO2 24  GLUCOSE 173*  BUN 14  CREATININE 0.65  CALCIUM 9.1   Liver Function Tests: No results for input(s): AST, ALT, ALKPHOS, BILITOT, PROT, ALBUMIN in the last 168 hours. No results for input(s): LIPASE, AMYLASE in the last 168 hours. No results for input(s): AMMONIA in the last 168 hours. CBC: Recent Labs  Lab 10/12/20 0844  WBC 8.3  NEUTROABS 6.8  HGB 13.5  HCT 41.9  MCV 97.4  PLT 230   Cardiac Enzymes: No results for input(s): CKTOTAL, CKMB, CKMBINDEX, TROPONINI in the last 168 hours. BNP: Invalid input(s): POCBNP CBG: No results for input(s): GLUCAP in the last 168 hours.  Radiological Exams on Admission:  DG Hip Unilat With Pelvis 2-3 Views Left  Result Date: 10/12/2020 CLINICAL DATA:  Pain after fall EXAM: DG HIP (WITH OR WITHOUT PELVIS) 2-3V LEFT COMPARISON:  None. FINDINGS: There is a comminuted displaced fracture in the proximal femur extending through the intertrochanteric region. No dislocation identified. IMPRESSION: Comminuted displaced intertrochanteric left hip fracture. Electronically Signed   By: Gerome Sam III M.D   On: 10/12/2020 10:30   DG Femur 1V Left  Result Date: 10/12/2020 CLINICAL DATA:  Larey Seat on left hip.  Pain. EXAM: LEFT FEMUR 1 VIEW COMPARISON:  None. FINDINGS: A single AP view of the distal half of the femur was obtained. No fractures noted in this region. IMPRESSION: No fracture seen in the distal half of the left femur on a single limited AP view. Electronically Signed   By: Gerome Sam III M.D   On: 10/12/2020 10:29      EKG: Independently reviewed.    A & P   Principal Problem:   Hip fracture (HCC) Active Problems:   Accelerated hypertension   Hyperlipidemia   Anxiety   1. Left hip fracture s/p mechanical fall a. Hip fracture  protocol for VTE prophylaxis and pain management b. Orthopedic surgery consulted by ED c. N.p.o. pending orthopedic surgery plan  2. Anxiety related to her hospitalization a. Ativan 0.5 mg twice daily as needed b. Continue home Zoloft tomorrow  3. Hypertensive urgency   Hypertension a. Improved with pain management b. Currently n.p.o. for possible upcoming procedure c. As needed Lopressor  4. Hyperlipidemia a. Continue home meds tomorrow   DVT prophylaxis: Lovenox, SCDs   Code Status: Full Code  Diet: N.p.o. Family  Communication: Admission, patients condition and plan of care including tests being ordered have been discussed with the patient who indicates understanding and agrees with the plan and Code Status. Patient's daughter was updated  Disposition Plan: The appropriate patient status for this patient is INPATIENT. Inpatient status is judged to be reasonable and necessary in order to provide the required intensity of service to ensure the patient's safety. The patient's presenting symptoms, physical exam findings, and initial radiographic and laboratory data in the context of their chronic comorbidities is felt to place them at high risk for further clinical deterioration. Furthermore, it is not anticipated that the patient will be medically stable for discharge from the hospital within 2 midnights of admission. The following factors support the patient status of inpatient.   " The patient's presenting symptoms include fall, left hip pain. " The worrisome physical exam findings include left hip pain, anxiety. " The initial radiographic and laboratory data are worrisome because of left hip fracture. " The chronic co-morbidities include hypertension, anxiety.   * I certify that at the point of admission it is my clinical judgment that the patient will require inpatient hospital care spanning beyond 2 midnights from the point of admission due to high intensity of service, high risk  for further deterioration and high frequency of surveillance required.*   Status is: Inpatient  Remains inpatient appropriate because:Ongoing active pain requiring inpatient pain management, IV treatments appropriate due to intensity of illness or inability to take PO and Inpatient level of care appropriate due to severity of illness   Dispo: The patient is from: Home              Anticipated d/c is to: SNF              Anticipated d/c date is: 3 days              Patient currently is not medically stable to d/c.   Consultants  . Orthopedic surgery  Procedures  . None  Time Spent on Admission: 60 minutes    Jae Dire, DO [facility]  10/12/2020, 12:10 PM

## 2020-10-12 NOTE — Anesthesia Procedure Notes (Addendum)
Procedure Name: Intubation Date/Time: 10/12/2020 3:14 PM Performed by: Lissa Morales, CRNA Pre-anesthesia Checklist: Patient identified, Emergency Drugs available, Suction available and Patient being monitored Patient Re-evaluated:Patient Re-evaluated prior to induction Oxygen Delivery Method: Circle system utilized Preoxygenation: Pre-oxygenation with 100% oxygen Induction Type: IV induction, Rapid sequence and Cricoid Pressure applied Laryngoscope Size: Mac and 4 Tube type: Oral Tube size: 7.5 mm Number of attempts: 1 Airway Equipment and Method: Stylet and Oral airway Placement Confirmation: ETT inserted through vocal cords under direct vision,  positive ETCO2 and breath sounds checked- equal and bilateral Secured at: 21 cm Tube secured with: Tape Dental Injury: Teeth and Oropharynx as per pre-operative assessment

## 2020-10-12 NOTE — ED Notes (Signed)
Patient transported to X-ray 

## 2020-10-12 NOTE — Addendum Note (Signed)
Addendum  created 10/12/20 1800 by Illene Silver, CRNA   Clinical Note Signed, Intraprocedure Blocks edited

## 2020-10-12 NOTE — Anesthesia Postprocedure Evaluation (Signed)
Anesthesia Post Note  Patient: April Tanner  Procedure(s) Performed: INTRAMEDULLARY (IM) NAIL FEMORAL (Left Hip)     Patient location during evaluation: PACU Anesthesia Type: General Level of consciousness: awake Pain management: pain level controlled Vital Signs Assessment: post-procedure vital signs reviewed and stable Respiratory status: spontaneous breathing Cardiovascular status: stable Postop Assessment: no apparent nausea or vomiting Anesthetic complications: no   No complications documented.  Last Vitals:  Vitals:   10/12/20 1630 10/12/20 1645  BP: (!) 164/73 (!) 166/64  Pulse: 70 71  Resp: 18 (!) 22  Temp: 36.4 C   SpO2: 100% 100%    Last Pain:  Vitals:   10/12/20 1630  TempSrc:   PainSc: Asleep                 Vallen Calabrese

## 2020-10-13 DIAGNOSIS — S72142A Displaced intertrochanteric fracture of left femur, initial encounter for closed fracture: Secondary | ICD-10-CM | POA: Diagnosis not present

## 2020-10-13 LAB — CBC
HCT: 31.4 % — ABNORMAL LOW (ref 36.0–46.0)
Hemoglobin: 10.2 g/dL — ABNORMAL LOW (ref 12.0–15.0)
MCH: 31.8 pg (ref 26.0–34.0)
MCHC: 32.5 g/dL (ref 30.0–36.0)
MCV: 97.8 fL (ref 80.0–100.0)
Platelets: 212 10*3/uL (ref 150–400)
RBC: 3.21 MIL/uL — ABNORMAL LOW (ref 3.87–5.11)
RDW: 11.9 % (ref 11.5–15.5)
WBC: 10.6 10*3/uL — ABNORMAL HIGH (ref 4.0–10.5)
nRBC: 0 % (ref 0.0–0.2)

## 2020-10-13 LAB — BASIC METABOLIC PANEL
Anion gap: 9 (ref 5–15)
BUN: 13 mg/dL (ref 8–23)
CO2: 24 mmol/L (ref 22–32)
Calcium: 8.5 mg/dL — ABNORMAL LOW (ref 8.9–10.3)
Chloride: 104 mmol/L (ref 98–111)
Creatinine, Ser: 0.73 mg/dL (ref 0.44–1.00)
GFR, Estimated: >60 mL/min (ref >60)
Glucose, Bld: 169 mg/dL — ABNORMAL HIGH (ref 70–99)
Potassium: 4 mmol/L (ref 3.5–5.1)
Sodium: 137 mmol/L (ref 135–145)

## 2020-10-13 MED ORDER — ADULT MULTIVITAMIN W/MINERALS CH
1.0000 | ORAL_TABLET | Freq: Every day | ORAL | Status: DC
  Administered 2020-10-13 – 2020-10-15 (×3): 1 via ORAL
  Filled 2020-10-13 (×3): qty 1

## 2020-10-13 MED ORDER — ENSURE ENLIVE PO LIQD
237.0000 mL | Freq: Two times a day (BID) | ORAL | Status: DC
  Administered 2020-10-13 – 2020-10-15 (×4): 237 mL via ORAL

## 2020-10-13 NOTE — Evaluation (Signed)
Occupational Therapy Evaluation Patient Details Name: April Tanner MRN: [id number] DOB: 1941-10-10 Today's Date: 10/13/2020    History of Present Illness Pt is a 79 year old woman who was taking her dog to the vet when she fell over a curb and fractured her L hip. Underwent IM nail. PMH: HTN, HLD, anxiety, urinary incontinence.   Clinical Impression   Pt was independent prior to admission, driving and working. She presents with L hip pain, decreased balance and generalized weakness. Pt lives with her daughter who can provide 24 care, but must manage 4 steps into her home to discharge. Pt transferred with min assist and increased time. She needs set up to total assist for ADL. Recommending SNF for further rehab.     Follow Up Recommendations  SNF;Supervision/Assistance - 24 hour    Equipment Recommendations  3 in 1 bedside commode    Recommendations for Other Services       Precautions / Restrictions Precautions Precautions: Fall Restrictions Weight Bearing Restrictions: Yes LLE Weight Bearing: Weight bearing as tolerated      Mobility Bed Mobility               General bed mobility comments: pt seated at EOB upon arrival  Transfers Overall transfer level: Needs assistance Equipment used: Rolling walker (2 wheeled) Transfers: Sit to/from [employer] Transfers Sit to Stand: Min guard;From elevated surface Stand pivot transfers: Min assist       General transfer comment: increased time, pt with anticipatory pain anxiety, cues for hand placement, steadying assist    Balance Overall balance assessment: Needs assistance   Sitting balance-Leahy Scale: Fair     Standing balance support: Bilateral upper extremity supported Standing balance-Leahy Scale: Poor                             ADL either performed or assessed with clinical judgement   ADL Overall ADL's : Needs assistance/impaired Eating/Feeding: Independent   Grooming: Brushing  hair;Sitting;Set up   Upper Body Bathing: Set up;Sitting   Lower Body Bathing: Total assistance;Sit to/from stand   Upper Body Dressing : Set up;Sitting   Lower Body Dressing: Total assistance;Sit to/from stand   Toilet Transfer: Minimal assistance;Stand-pivot;BSC;RW                   Vision Baseline Vision/History: Wears glasses Wears Glasses: At all times Patient Visual Report: No change from baseline       Perception     Praxis      Pertinent Vitals/Pain Pain Assessment: Faces Faces Pain Scale: Hurts even more Pain Location: L hip Pain Descriptors / Indicators: Aching Pain Intervention(s): Monitored during session;Repositioned;Ice applied;Patient requesting pain meds-RN notified;RN gave pain meds during session     Hand Dominance Right   Extremity/Trunk Assessment Upper Extremity Assessment Upper Extremity Assessment: Overall WFL for tasks assessed   Lower Extremity Assessment Lower Extremity Assessment: Defer to PT evaluation   Cervical / Trunk Assessment Cervical / Trunk Assessment: Other exceptions Cervical / Trunk Exceptions: obesity   Communication Communication Communication: No difficulties   Cognition Arousal/Alertness: Awake/alert Behavior During Therapy: WFL for tasks assessed/performed Overall Cognitive Status: Within Functional Limits for tasks assessed                                     General Comments       Exercises  Shoulder Instructions      Home Living Family/patient expects to be discharged to:: Private residence Living Arrangements: Children Available Help at Discharge: Family;Available 24 hours/day Type of Home: Mobile home Home Access: Stairs to enter Entrance Stairs-Number of Steps: 4 Entrance Stairs-Rails: Right;Left Home Layout: One level     Bathroom Shower/Tub: Tub/shower unit;Walk-in shower   Bathroom Toilet: Standard     Home Equipment: Walker - 4 wheels          Prior  Functioning/Environment Level of Independence: Independent        Comments: drives, works in Clinical biochemist at [employer]        OT Problem List: Decreased strength;Impaired balance (sitting and/or standing);Decreased knowledge of use of DME or AE;Obesity;Pain      OT Treatment/Interventions: Self-care/ADL training;DME and/or AE instruction;Therapeutic activities;Patient/family education;Balance training    OT Goals(Current goals can be found in the care plan section) Acute Rehab OT Goals Patient Stated Goal: walk  OT Goal Formulation: With patient Time For Goal Achievement: 10/27/20 Potential to Achieve Goals: Good ADL Goals Pt Will Perform Grooming: with min guard assist;standing Pt Will Perform Lower Body Bathing: with min assist;with adaptive equipment;sit to/from stand Pt Will Perform Lower Body Dressing: with min assist;with adaptive equipment;sit to/from stand Pt Will Transfer to Toilet: with min guard assist;ambulating;bedside commode (over toilet) Pt Will Perform Toileting - Clothing Manipulation and hygiene: with min assist;sit to/from stand Additional ADL Goal #1: Pt will perform bed mobility with min assist in preparation for ADL.  OT Frequency: Min 2X/week   Barriers to D/C:            Co-evaluation              AM-PAC OT "6 Clicks" Daily Activity     Outcome Measure Help from another person eating meals?: None Help from another person taking care of personal grooming?: A Little Help from another person toileting, which includes using toliet, bedpan, or urinal?: A Lot Help from another person bathing (including washing, rinsing, drying)?: A Lot Help from another person to put on and taking off regular upper body clothing?: A Little Help from another person to put on and taking off regular lower body clothing?: Total 6 Click Score: 15   End of Session Equipment Utilized During Treatment: Gait belt;Rolling walker Nurse Communication: Mobility  status  Activity Tolerance: Patient tolerated treatment well Patient left: in chair;with call bell/phone within reach;with nursing/sitter in room  OT Visit Diagnosis: Unsteadiness on feet (R26.81);Other abnormalities of gait and mobility (R26.89);Pain;Muscle weakness (generalized) (M62.81)                Time: 7591-6384 OT Time Calculation (min): 29 min Charges:  OT General Charges $OT Visit: 1 Visit OT Evaluation $OT Eval Moderate Complexity: 1 Mod OT Treatments $Self Care/Home Management : 8-22 mins  Martie Round, OTR/L Acute Rehabilitation Services Pager: [phone] Office: [phone]  April Tanner 10/13/2020, 9:50 AM

## 2020-10-13 NOTE — NC FL2 (Signed)
[place] MEDICAID FL2 LEVEL OF CARE SCREENING TOOL     IDENTIFICATION  Patient Name: April Tanner Birthdate: Oct 01, 1941 Sex: female Admission Date (Current Location): 10/12/2020  [facility] and [place] Number:  Producer, television/film/video and Address:  [facility],  [address] [place] [address]      Provider Number: [id number]  Attending Physician Name and Address:  Albertine Grates, MD  Relative Name and Phone Number:       Current Level of Care: Hospital Recommended Level of Care: Skilled Nursing Facility Prior Approval Number:    Date Approved/Denied:   PASRR Number:   [id number]  Discharge Plan: SNF    Current Diagnoses: Patient Active Problem List   Diagnosis Date Noted  . Accelerated hypertension 10/12/2020  . Hyperlipidemia 10/12/2020  . Anxiety 10/12/2020  . Displaced intertrochanteric fracture of left femur, initial encounter for closed fracture (HCC) 10/12/2020    Orientation RESPIRATION BLADDER Height & Weight     Self, Time, Situation, Place  Normal Continent Weight: 183 lb (83 kg) Height:  5\' 5"  (165.1 cm)  BEHAVIORAL SYMPTOMS/MOOD NEUROLOGICAL BOWEL NUTRITION STATUS      Continent Diet (See dc summary)  AMBULATORY STATUS COMMUNICATION OF NEEDS Skin   Extensive Assist Verbally Surgical wounds (Left hip incision)                       Personal Care Assistance Level of Assistance  Bathing, Dressing, Feeding Bathing Assistance: Independent Feeding assistance: Limited assistance Dressing Assistance: Independent     Functional Limitations Info  Sight, Hearing, Speech Sight Info: Impaired Hearing Info: Adequate Speech Info: Adequate    SPECIAL CARE FACTORS FREQUENCY  PT (By licensed PT), OT (By licensed OT)     PT Frequency: 5x/week OT Frequency: 5x/week            Contractures Contractures Info: Not present    Additional Factors Info  Code Status, Allergies Code Status Info: Full Allergies Info: NKA            Current Medications (10/13/2020):  This is the current hospital active medication list Current Facility-Administered Medications  Medication Dose Route Frequency Provider Last Rate Last Admin  . 0.9 %  sodium chloride infusion   Intravenous Continuous 10/15/2020, MD 10 mL/hr at 10/13/20 1016 Rate Verify at 10/13/20 1016  . acetaminophen (TYLENOL) tablet 1,000 mg  1,000 mg Oral Q6H 10/15/20, MD   1,000 mg at 10/13/20 1052  . acetaminophen (TYLENOL) tablet 325-650 mg  325-650 mg Oral Q6H PRN 10/15/20, MD      . alum & mag hydroxide-simeth (MAALOX/MYLANTA) 200-200-20 MG/5ML suspension 30 mL  30 mL Oral Q4H PRN 07-06-2001, MD      . amLODipine (NORVASC) tablet 5 mg  5 mg Oral BID Tarry Kos, MD   5 mg at 10/13/20 0932  . atenolol (TENORMIN) tablet 25 mg  25 mg Oral QHS 10/15/20, MD      . docusate sodium (COLACE) capsule 100 mg  100 mg Oral BID Tarry Kos, MD   100 mg at 10/13/20 0932  . enoxaparin (LOVENOX) injection 40 mg  40 mg Subcutaneous Q24H 10/15/20, MD   40 mg at 10/13/20 0933  . HYDROmorphone (DILAUDID) injection 0.5-1 mg  0.5-1 mg Intravenous Q4H PRN 10/15/20, MD      . LORazepam (ATIVAN) injection 0.5 mg  0.5 mg Intravenous BID PRN Tarry Kos, MD      .  magnesium citrate solution 1 Bottle  1 Bottle Oral Once PRN Tarry Kos, MD      . menthol-cetylpyridinium (CEPACOL) lozenge 3 mg  1 lozenge Oral PRN Tarry Kos, MD       Or  . phenol (CHLORASEPTIC) mouth spray 1 spray  1 spray Mouth/Throat PRN Tarry Kos, MD      . methocarbamol (ROBAXIN) tablet 500 mg  500 mg Oral Q6H PRN Tarry Kos, MD   500 mg at 10/13/20 0932   Or  . methocarbamol (ROBAXIN) 500 mg in dextrose 5 % 50 mL IVPB  500 mg Intravenous Q6H PRN Tarry Kos, MD      . metoprolol tartrate (LOPRESSOR) injection 5 mg  5 mg Intravenous Q8H PRN Tarry Kos, MD      . ondansetron [facility]) tablet 4 mg  4 mg Oral Q6H PRN Tarry Kos, MD       Or  . ondansetron [facility])  injection 4 mg  4 mg Intravenous Q6H PRN Tarry Kos, MD      . oxyCODONE (Oxy IR/ROXICODONE) immediate release tablet 10-15 mg  10-15 mg Oral Q4H PRN Tarry Kos, MD   10 mg at 10/13/20 0933  . oxyCODONE (Oxy IR/ROXICODONE) immediate release tablet 5-10 mg  5-10 mg Oral Q4H PRN Tarry Kos, MD   5 mg at 10/13/20 1052  . polyethylene glycol (MIRALAX / GLYCOLAX) packet 17 g  17 g Oral Daily PRN Tarry Kos, MD      . sertraline (ZOLOFT) tablet 50 mg  50 mg Oral QHS Tarry Kos, MD      . sorbitol 70 % solution 30 mL  30 mL Oral Daily PRN Tarry Kos, MD         Discharge Medications: Please see discharge summary for a list of discharge medications.  Relevant Imaging Results:  Relevant Lab Results:   Additional Information SSN: [id number]  Coralyn Helling, LCSW

## 2020-10-13 NOTE — Plan of Care (Signed)
  Problem: Clinical Measurements: Goal: Respiratory complications will improve Outcome: Progressing   Problem: Clinical Measurements: Goal: Cardiovascular complication will be avoided Outcome: Progressing   Problem: Coping: Goal: Level of anxiety will decrease Outcome: Progressing   Problem: Elimination: Goal: Will not experience complications related to bowel motility Outcome: Progressing   Problem: Skin Integrity: Goal: Risk for impaired skin integrity will decrease Outcome: Progressing   

## 2020-10-13 NOTE — Evaluation (Signed)
Physical Therapy Evaluation Patient Details Name: April Tanner MRN: [id number] DOB: February 13, 1941 Today's Date: 10/13/2020   History of Present Illness  Pt is a 79 year old woman who was taking her dog to the vet when she fell over a curb and fractured her L hip. Underwent IM nail. PMH: HTN, HLD, anxiety, urinary incontinence.  Clinical Impression  Pt admitted with above diagnosis.  Pt will benefit from SNF post acute. Will continue follow in acute setting  Pt currently with functional limitations due to the deficits listed below (see PT Problem List). Pt will benefit from skilled PT to increase their independence and safety with mobility to allow discharge to the venue listed below.       Follow Up Recommendations SNF    Equipment Recommendations  Other (comment) (TBD)    Recommendations for Other Services       Precautions / Restrictions Precautions Precautions: Fall Restrictions Weight Bearing Restrictions: No LLE Weight Bearing: Weight bearing as tolerated      Mobility  Bed Mobility               General bed mobility comments: pt OOB in recliner on arrival  Transfers Overall transfer level: Needs assistance Equipment used: Rolling walker (2 wheeled) Transfers: Sit to/from Stand Sit to Stand: Min assist Stand pivot transfers: Min assist       General transfer comment: increased time, pt with anticipatory pain anxiety, cues for hand placement, steadying assist  Ambulation/Gait Ambulation/Gait assistance: Min assist;+2 safety/equipment;+2 physical assistance Gait Distance (Feet): 3 Feet Assistive device: Rolling walker (2 wheeled) Gait Pattern/deviations: Step-to pattern;Decreased stance time - left;Decreased step length - right;Decreased step length - left;Decreased weight shift to left Gait velocity: decr Gait velocity interpretation: <1.31 ft/sec, indicative of household ambulator General Gait Details: cues for sequence and RW position, wt shift to UEs  to assist with advancing RLE. much difficulty d/t pain, incr time  Stairs            Wheelchair Mobility    Modified Rankin (Stroke Patients Only)       Balance Overall balance assessment: Needs assistance   Sitting balance-Leahy Scale: Fair     Standing balance support: Bilateral upper extremity supported;During functional activity Standing balance-Leahy Scale: Poor                               Pertinent Vitals/Pain Pain Assessment: Faces Faces Pain Scale: Hurts whole lot Pain Location: L hip Pain Descriptors / Indicators: Aching;Sore Pain Intervention(s): Limited activity within patient's tolerance;Monitored during session;Premedicated before session;Repositioned;Ice applied    Home Living Family/patient expects to be discharged to:: Private residence Living Arrangements: Children Available Help at Discharge: Family;Available 24 hours/day Type of Home: Mobile home Home Access: Stairs to enter Entrance Stairs-Rails: Doctor, general practice of Steps: 4 Home Layout: One level Home Equipment: Walker - 4 wheels Additional Comments: dtr coming from out of state but "does not handle stress well" per pt    Prior Function Level of Independence: Independent         Comments: drives, works in Clinical biochemist at [employer]     Hand Dominance   Dominant Hand: Right    Extremity/Trunk Assessment   Upper Extremity Assessment Upper Extremity Assessment: Defer to OT evaluation    Lower Extremity Assessment Lower Extremity Assessment: LLE deficits/detail LLE Deficits / Details: ankle WFL, knee and hip 2/5 LLE: Unable to fully assess due to pain  Cervical / Trunk Assessment Cervical / Trunk Assessment: Other exceptions Cervical / Trunk Exceptions: obesity  Communication   Communication: No difficulties  Cognition Arousal/Alertness: Awake/alert Behavior During Therapy: WFL for tasks assessed/performed Overall Cognitive Status:  Within Functional Limits for tasks assessed                                        General Comments      Exercises     Assessment/Plan    PT Assessment Patient needs continued PT services  PT Problem List Decreased strength;Decreased range of motion;Decreased activity tolerance;Decreased balance;Decreased knowledge of use of DME;Pain;Decreased mobility       PT Treatment Interventions DME instruction;Therapeutic activities;Gait training;Functional mobility training;Therapeutic exercise    PT Goals (Current goals can be found in the Care Plan section)  Acute Rehab PT Goals Patient Stated Goal: walk  PT Goal Formulation: With patient Time For Goal Achievement: 10/20/20 Potential to Achieve Goals: Good    Frequency Min 3X/week   Barriers to discharge        Co-evaluation               AM-PAC PT "6 Clicks" Mobility  Outcome Measure Help needed turning from your back to your side while in a flat bed without using bedrails?: A Lot Help needed moving from lying on your back to sitting on the side of a flat bed without using bedrails?: A Lot Help needed moving to and from a bed to a chair (including a wheelchair)?: A Lot Help needed standing up from a chair using your arms (e.g., wheelchair or bedside chair)?: A Lot Help needed to walk in hospital room?: A Lot Help needed climbing 3-5 steps with a railing? : Total 6 Click Score: 11    End of Session Equipment Utilized During Treatment: Gait belt Activity Tolerance: Patient tolerated treatment well Patient left: in chair;with call bell/phone within reach Nurse Communication: Mobility status PT Visit Diagnosis: Difficulty in walking, not elsewhere classified (R26.2);Other abnormalities of gait and mobility (R26.89)    Time: 1005-1026 PT Time Calculation (min) (ACUTE ONLY): 21 min   Charges:   PT Evaluation $PT Eval Low Complexity: 1 Low          Gunnison Chahal, PT  Acute Rehab Dept ([facility])  [phone] Pager [phone]  10/13/2020   [facility] 10/13/2020, 10:43 AM

## 2020-10-13 NOTE — TOC Initial Note (Signed)
Transition of Care [facility]) - Initial/Assessment Note    Patient Details  Name: April Tanner MRN: [id number] Date of Birth: January 20, 1941  [facility] CM/SW Contact:    Coralyn Helling, LCSW Phone Number: 10/13/2020, 12:22 PM  Clinical Narrative:       Patient is from home where she was independent in ADLs, working and driving until hospital stay. Patient lives with adult daughter and granddaughter. Patient reports daughter can provide 24 hour care  But currently out of state. Patient prefers to go home with home health and DME if daughter is able to return to [facility]. Patient will be speaking with daughter today. If not patient is agreeable to SNF and has been worked up for SNF placement if she has to dc before her daughter can return. TOC will continue to follow for dc needs.              Expected Discharge Plan: Skilled Nursing Facility Barriers to Discharge: Continued Medical Work up   Patient Goals and CMS Choice Patient states their goals for this hospitalization and ongoing recovery are:: Go home and get stronger CMS Medicare.gov Compare Post Acute Care list provided to:: Patient Choice offered to / list presented to : Patient  Expected Discharge Plan and Services Expected Discharge Plan: Skilled Nursing Facility In-house Referral: NA Discharge Planning Services: NA Post Acute Care Choice: Skilled Nursing Facility Living arrangements for the past 2 months: Single Family Home                                      Prior Living Arrangements/Services Living arrangements for the past 2 months: Single Family Home Lives with:: Adult Children Patient language and need for interpreter reviewed:: Yes Do you feel safe going back to the place where you live?: Yes (Depending on level of support at home.)      Need for Family Participation in Patient Care: Yes (Comment) Care giver support system in place?: Yes (comment)   Criminal Activity/Legal Involvement Pertinent to  Current Situation/Hospitalization: No - Comment as needed  Activities of Daily Living Home Assistive Devices/Equipment: None ADL Screening (condition at time of admission) Patient's cognitive ability adequate to safely complete daily activities?: Yes Is the patient deaf or have difficulty hearing?: No Does the patient have difficulty seeing, even when wearing glasses/contacts?: Yes Does the patient have difficulty concentrating, remembering, or making decisions?: No Patient able to express need for assistance with ADLs?: No Does the patient have difficulty dressing or bathing?: Yes Independently performs ADLs?: No Communication: Independent Dressing (OT): Needs assistance Is this a change from baseline?: Change from baseline, expected to last >3 days Grooming: Needs assistance Is this a change from baseline?: Change from baseline, expected to last >3 days Feeding: Independent Bathing: Needs assistance Is this a change from baseline?: Change from baseline, expected to last >3 days Toileting: Needs assistance Is this a change from baseline?: Change from baseline, expected to last >3days In/Out Bed: Needs assistance Is this a change from baseline?: Change from baseline, expected to last >3 days Walks in Home: Needs assistance Is this a change from baseline?: Change from baseline, expected to last >3 days Does the patient have difficulty walking or climbing stairs?: Yes Weakness of Legs: Right Weakness of Arms/Hands: Both  Permission Sought/Granted Permission sought to share information with : Family Supports Permission granted to share information with : Yes, Verbal Permission Granted  Share Information  with NAME: Baxter Hire     Permission granted to share info w Relationship: daughter     Emotional Assessment Appearance:: Appears stated age Attitude/Demeanor/Rapport: Engaged Affect (typically observed): Accepting Orientation: : Oriented to Self, Oriented to Place, Oriented to   Time, Oriented to Situation Alcohol / Substance Use: Not Applicable Psych Involvement: No (comment)  Admission diagnosis:  Hip fracture (HCC) [S72.009A] Closed fracture of left hip, initial encounter (HCC) [S72.002A] Fall, initial encounter [W19.XXXA] Patient Active Problem List   Diagnosis Date Noted  . Accelerated hypertension 10/12/2020  . Hyperlipidemia 10/12/2020  . Anxiety 10/12/2020  . Displaced intertrochanteric fracture of left femur, initial encounter for closed fracture (HCC) 10/12/2020   PCP:  Verlon Au, MD Pharmacy:   [facility] [address], [place] - [address] [place] [address] Phone: [phone] Fax: [phone]     Social Determinants of Health (SDOH) Interventions    Readmission Risk Interventions Readmission Risk Prevention Plan 10/13/2020  Transportation Screening Complete

## 2020-10-13 NOTE — Progress Notes (Signed)
Patient stable No events Sitting in recliner eating lunch Pain well controlled, mainly soreness Did well with 1st PT session lovenox x 2 weeks for DVT prophylaxis RTC 2 weeks for suture removal and repeat xrays.  Mayra Reel, MD [facility] [phone] 1:16 PM

## 2020-10-13 NOTE — Plan of Care (Signed)

## 2020-10-13 NOTE — Progress Notes (Signed)
Initial Nutrition Assessment  RD working remotely.  DOCUMENTATION CODES:   Obesity unspecified  INTERVENTION:   - Ensure Enlive po BID, each supplement provides 350 kcal and 20 grams of protein  - MVI with minerals daily  NUTRITION DIAGNOSIS:   Increased nutrient needs related to hip fracture, post-op healing as evidenced by estimated needs.  GOAL:   Patient will meet greater than or equal to 90% of their needs  MONITOR:   PO intake, Supplement acceptance, Labs, Weight trends, Skin  REASON FOR ASSESSMENT:   Consult Hip fracture protocol  ASSESSMENT:   79 year old female who presented to the ED on 10/16 after a fall. PMH of HTN, HLD. Pt found to have comminuted intertrochanteric fracture.   10/16 - s/p open treatment of intertrochanteric fracture with intramedullary implant  Diet advanced from clear liquids to regular this morning. Pt consumed 100% of regular breakfast per documentation.  Per flowsheet documentation, pt with +1 pitting edema to LLE.  Spoke with pt via phone call to room. Pt reports appetite is excellent. She states that she would be doing a lot better if she could just get some "real food." She reports her first "real" meal tray was just delivered for lunch and she is feeling hungry.  Pt denies any issues with appetite PTA. Pt reports that her weight has been stable.  Pt amenable to RD ordering oral nutrition supplement to aid in healing s/p surgery. Pt prefers chocolate flavor. Discussed importance of adequate PO intake in healing. Pt expresses understanding. RD will also order daily MVI.  Medications reviewed and include: colace, IV abx IVF: NS @ 75 ml/hr  Labs reviewed.  UOP: 1125 ml x 24 hours  NUTRITION - FOCUSED PHYSICAL EXAM:  Unable to complete at this time. RD working remotely.  Diet Order:   Diet Order            Diet regular Room service appropriate? Yes; Fluid consistency: Thin  Diet effective now                  EDUCATION NEEDS:   Education needs have been addressed  Skin:  Skin Assessment: Skin Integrity Issues: Incisions: hip  Last BM:  10/12/20  Height:   Ht Readings from Last 1 Encounters:  10/12/20 5\' 5"  (1.651 m)    Weight:   Wt Readings from Last 1 Encounters:  10/12/20 83 kg    BMI:  Body mass index is 30.45 kg/m.  Estimated Nutritional Needs:   Kcal:  1650-1850  Protein:  80-95 grams  Fluid:  1.6-1.8 L    10/14/20, MS, RD, LDN Inpatient Clinical Dietitian Please see AMiON for contact information.

## 2020-10-13 NOTE — Progress Notes (Signed)
PROGRESS NOTE    April Tanner  GQB:[id number] DOB: April 20, 1941 DOA: 10/12/2020 PCP: Verlon Au, MD    Chief Complaint  Patient presents with  . Fall    Brief Narrative:  Mechanical fall with comminuted displaced intertrochanteric left hip fracture.     Subjective:  Postop day 1,  limited mobility due to pain  Assessment & Plan:   Principal Problem:   Displaced intertrochanteric fracture of left femur, initial encounter for closed fracture [facility]) Active Problems:   Accelerated hypertension   Hyperlipidemia   Anxiety  Left hip fracture from mechanical fall -Baseline very functional still works -S/p Open treatment of intertrochanteric fracture with intramedullary implant on October 16 -Management per Ortho  Hypertension urgency on presentation likely due to pain Blood pressure much improved today, continue Norvasc and atenolol  Elevated fasting blood glucose No prior history of diabetes, will check A1c     DVT prophylaxis: enoxaparin (LOVENOX) injection 40 mg Start: 10/13/20 1000 SCDs Start: 10/12/20 1743 SCDs Start: 10/12/20 1203   Code Status: Full Family Communication: Patient Disposition:   Status is: Inpatient  Dispo: The patient is from: Home              Anticipated d/c is to: To be determined, home with home health versus skilled nursing facility              Anticipated d/c date is: When pain better controlled with increased mobility, need Ortho clearance                Consultants:   Ortho Dr Roda Shutters  Procedures:  Open treatment of intertrochanteric fracture with intramedullary implant  Antimicrobials:   Perioperative ancef     Objective: Vitals:   10/12/20 1951 10/12/20 2043 10/13/20 0134 10/13/20 0520  BP: 136/70 (!) 145/94 133/67 (!) 141/62  Pulse: 63 66 69 68  Resp: 20 16 17 15   Temp: 98 F (36.7 C) 97.7 F (36.5 C) 98.4 F (36.9 C) 98.4 F (36.9 C)  TempSrc: Oral Oral Oral Oral  SpO2: 97% 100% 96% 96%  Weight:       Height:        Intake/Output Summary (Last 24 hours) at 10/13/2020 0830 Last data filed at 10/13/2020 0600 Gross per 24 hour  Intake 3309.73 ml  Output 1275 ml  Net 2034.73 ml   Filed Weights   10/12/20 0801  Weight: 83 kg    Examination:  General exam: calm, NAD Respiratory system: Clear to auscultation. Respiratory effort normal. Cardiovascular system: S1 & S2 heard, RRR.  No pedal edema. Gastrointestinal system: Abdomen is nondistended, soft and nontender.. Normal bowel sounds heard. Central nervous system: Alert and oriented. No focal neurological deficits. Extremities: Postop changes left hip, neurovascular intact distally Skin: No rashes, lesions or ulcers Psychiatry: Judgement and insight appear normal. Mood & affect appropriate.     Data Reviewed: I have personally reviewed following labs and imaging studies  CBC: Recent Labs  Lab 10/12/20 0844 10/13/20 0340  WBC 8.3 10.6*  NEUTROABS 6.8  --   HGB 13.5 10.2*  HCT 41.9 31.4*  MCV 97.4 97.8  PLT 230 212    Basic Metabolic Panel: Recent Labs  Lab 10/12/20 0844 10/13/20 0340  NA 138 137  K 3.9 4.0  CL 105 104  CO2 24 24  GLUCOSE 173* 169*  BUN 14 13  CREATININE 0.65 0.73  CALCIUM 9.1 8.5*    GFR: Estimated Creatinine Clearance: 61.7 mL/min (by C-G formula based on SCr of 0.73  mg/dL).  Liver Function Tests: No results for input(s): AST, ALT, ALKPHOS, BILITOT, PROT, ALBUMIN in the last 168 hours.  CBG: No results for input(s): GLUCAP in the last 168 hours.   Recent Results (from the past 240 hour(s))  MRSA PCR Screening     Status: None   Collection Time: 10/12/20  1:01 PM   Specimen: Nasal Mucosa; Nasopharyngeal  Result Value Ref Range Status   MRSA by PCR NEGATIVE NEGATIVE Final    Comment:        The GeneXpert MRSA Assay (FDA approved for NASAL specimens only), is one component of a comprehensive MRSA colonization surveillance program. It is not intended to diagnose  MRSA infection nor to guide or monitor treatment for MRSA infections. Performed at [facility], [address], [place], [place] [address]   Respiratory Panel by RT PCR (Flu A&B, Covid) - Nasal Mucosa     Status: None   Collection Time: 10/12/20  1:01 PM   Specimen: Nasal Mucosa; Nasopharyngeal  Result Value Ref Range Status   SARS Coronavirus 2 by RT PCR NEGATIVE NEGATIVE Final    Comment: (NOTE) SARS-CoV-2 target nucleic acids are NOT DETECTED.  The SARS-CoV-2 RNA is generally detectable in upper respiratoy specimens during the acute phase of infection. The lowest concentration of SARS-CoV-2 viral copies this assay can detect is 131 copies/mL. A negative result does not preclude SARS-Cov-2 infection and should not be used as the sole basis for treatment or other patient management decisions. A negative result may occur with  improper specimen collection/handling, submission of specimen other than nasopharyngeal swab, presence of viral mutation(s) within the areas targeted by this assay, and inadequate number of viral copies (<131 copies/mL). A negative result must be combined with clinical observations, patient history, and epidemiological information. The expected result is Negative.  Fact Sheet for Patients:  https://www.moore.com/  Fact Sheet for Healthcare Providers:  https://www.young.biz/  This test is no t yet approved or cleared by the [place] FDA and  has been authorized for detection and/or diagnosis of SARS-CoV-2 by FDA under an Emergency Use Authorization (EUA). This EUA will remain  in effect (meaning this test can be used) for the duration of the COVID-19 declaration under Section 564(b)(1) of the Act, 21 U.S.C. section 360bbb-3(b)(1), unless the authorization is terminated or revoked sooner.     Influenza A by PCR NEGATIVE NEGATIVE Final   Influenza B by PCR NEGATIVE NEGATIVE Final    Comment:  (NOTE) The Xpert Xpress SARS-CoV-2/FLU/RSV assay is intended as an aid in  the diagnosis of influenza from Nasopharyngeal swab specimens and  should not be used as a sole basis for treatment. Nasal washings and  aspirates are unacceptable for Xpert Xpress SARS-CoV-2/FLU/RSV  testing.  Fact Sheet for Patients: https://www.moore.com/  Fact Sheet for Healthcare Providers: https://www.young.biz/  This test is not yet approved or cleared by the [place] FDA and  has been authorized for detection and/or diagnosis of SARS-CoV-2 by  FDA under an Emergency Use Authorization (EUA). This EUA will remain  in effect (meaning this test can be used) for the duration of the  Covid-19 declaration under Section 564(b)(1) of the Act, 21  U.S.C. section 360bbb-3(b)(1), unless the authorization is  terminated or revoked. Performed at [facility], [address] [place], [place] [address]          Radiology Studies: DG C-Arm 1-60 Min-No Report  Result Date: 10/12/2020 CLINICAL DATA:  Operative imaging provided for ORIF of  an intertrochanteric left proximal femur fracture. EXAM: OPERATIVE left HIP (WITH PELVIS IF PERFORMED) 4 VIEWS TECHNIQUE: Fluoroscopic spot image(s) were submitted for interpretation post-operatively. COMPARISON:  10/12/2020 at 10 a.m. FINDINGS: Four submitted images show placement of 2 screws supported by a long intramedullary rod reducing the primary fracture components of the intertrochanteric proximal femur fracture into near anatomic alignment. The orthopedic hardware is well-seated. IMPRESSION: Well-positioned proximal left femur intertrochanteric fracture following ORIF. Electronically Signed   By: Amie Portland M.D.   On: 10/12/2020 16:34   DG HIP OPERATIVE UNILAT WITH PELVIS LEFT  Result Date: 10/12/2020 CLINICAL DATA:  Operative imaging provided for ORIF of an intertrochanteric left proximal femur fracture.  EXAM: OPERATIVE left HIP (WITH PELVIS IF PERFORMED) 4 VIEWS TECHNIQUE: Fluoroscopic spot image(s) were submitted for interpretation post-operatively. COMPARISON:  10/12/2020 at 10 a.m. FINDINGS: Four submitted images show placement of 2 screws supported by a long intramedullary rod reducing the primary fracture components of the intertrochanteric proximal femur fracture into near anatomic alignment. The orthopedic hardware is well-seated. IMPRESSION: Well-positioned proximal left femur intertrochanteric fracture following ORIF. Electronically Signed   By: Amie Portland M.D.   On: 10/12/2020 16:34   DG Hip Unilat With Pelvis 2-3 Views Left  Result Date: 10/12/2020 CLINICAL DATA:  Pain after fall EXAM: DG HIP (WITH OR WITHOUT PELVIS) 2-3V LEFT COMPARISON:  None. FINDINGS: There is a comminuted displaced fracture in the proximal femur extending through the intertrochanteric region. No dislocation identified. IMPRESSION: Comminuted displaced intertrochanteric left hip fracture. Electronically Signed   By: Gerome Sam III M.D   On: 10/12/2020 10:30   DG Femur 1V Left  Result Date: 10/12/2020 CLINICAL DATA:  Larey Seat on left hip.  Pain. EXAM: LEFT FEMUR 1 VIEW COMPARISON:  None. FINDINGS: A single AP view of the distal half of the femur was obtained. No fractures noted in this region. IMPRESSION: No fracture seen in the distal half of the left femur on a single limited AP view. Electronically Signed   By: Gerome Sam III M.D   On: 10/12/2020 10:29        Scheduled Meds: . acetaminophen  1,000 mg Oral Q6H  . amLODipine  5 mg Oral BID  . atenolol  25 mg Oral QHS  . docusate sodium  100 mg Oral BID  . enoxaparin (LOVENOX) injection  40 mg Subcutaneous Q24H  . sertraline  50 mg Oral QHS   Continuous Infusions: . sodium chloride 75 mL/hr at 10/13/20 0600  .  ceFAZolin (ANCEF) IV Stopped (10/13/20 0317)  . methocarbamol (ROBAXIN) IV       LOS: 1 day   Time spent:25 mins Greater than 50% of  this time was spent in counseling, explanation of diagnosis, planning of further management, and coordination of care.  I have personally reviewed and interpreted on  10/13/2020 daily labs,I reviewed all nursing notes, pharmacy notes, consultant notes,  vitals, pertinent old records  I have discussed plan of care as described above with RN , patient  on 10/13/2020  Voice Recognition /Dragon dictation system was used to create this note, attempts have been made to correct errors. Please contact the author with questions and/or clarifications.   Albertine Grates, MD PhD FACP [facility]  Available via Epic secure chat 7am-7pm for nonurgent issues Please page for urgent issues To page the attending provider between 7A-7P or the covering provider during after hours 7P-7A, please log into the web site [email or web address] and access using universal [place] password for that web  site. If you do not have the password, please call the hospital operator.    10/13/2020, 8:30 AM

## 2020-10-13 NOTE — Progress Notes (Signed)
Patient refusing at this time to have foley removed. Wants to wait until she eats breakfast and has physical therapy.

## 2020-10-14 ENCOUNTER — Encounter (HOSPITAL_COMMUNITY): Payer: Self-pay | Admitting: Orthopaedic Surgery

## 2020-10-14 DIAGNOSIS — S72142A Displaced intertrochanteric fracture of left femur, initial encounter for closed fracture: Secondary | ICD-10-CM | POA: Diagnosis not present

## 2020-10-14 LAB — CBC
HCT: 28.6 % — ABNORMAL LOW (ref 36.0–46.0)
Hemoglobin: 9.3 g/dL — ABNORMAL LOW (ref 12.0–15.0)
MCH: 32.1 pg (ref 26.0–34.0)
MCHC: 32.5 g/dL (ref 30.0–36.0)
MCV: 98.6 fL (ref 80.0–100.0)
Platelets: 208 10*3/uL (ref 150–400)
RBC: 2.9 MIL/uL — ABNORMAL LOW (ref 3.87–5.11)
RDW: 12.2 % (ref 11.5–15.5)
WBC: 10.5 10*3/uL (ref 4.0–10.5)
nRBC: 0 % (ref 0.0–0.2)

## 2020-10-14 LAB — BASIC METABOLIC PANEL
Anion gap: 7 (ref 5–15)
BUN: 20 mg/dL (ref 8–23)
CO2: 26 mmol/L (ref 22–32)
Calcium: 9 mg/dL (ref 8.9–10.3)
Chloride: 106 mmol/L (ref 98–111)
Creatinine, Ser: 0.75 mg/dL (ref 0.44–1.00)
GFR, Estimated: >60 mL/min (ref >60)
Glucose, Bld: 139 mg/dL — ABNORMAL HIGH (ref 70–99)
Potassium: 4 mmol/L (ref 3.5–5.1)
Sodium: 139 mmol/L (ref 135–145)

## 2020-10-14 LAB — HEMOGLOBIN A1C
Hgb A1c MFr Bld: 4.8 % (ref 4.8–5.6)
Mean Plasma Glucose: 91 mg/dL

## 2020-10-14 NOTE — Progress Notes (Signed)
PROGRESS NOTE    April Tanner  LSL:[id number] DOB: Mar 27, 1941 DOA: 10/12/2020 PCP: Verlon Au, MD    Brief Narrative:  79 year old female with history of hypertension hyperlipidemia presented with mechanical fall, she tripped over a curb while walking her dog.  In the emergency room blood pressure 207 x 99.  Left hip showed comminuted intertrochanteric fracture of the left hip.  Admitted for surgical intervention.   Assessment & Plan:   Principal Problem:   Displaced intertrochanteric fracture of left femur, initial encounter for closed fracture [facility]) Active Problems:   Accelerated hypertension   Hyperlipidemia   Anxiety  Displaced intertrochanteric fracture of the left femur, closed and traumatic: Status post ORIF with intramedullary implant on 10/16.  Improving appropriately postop. Pain management with Tylenol, oxycodone along with laxative. Weightbearing as tolerated.  Continue to work with PT OT. DVT prophylaxis with Lovenox for 4 weeks. Outpatient orthopedic follow-up. Discharge disposition, home with home health PT versus SNF.  Pending family members availability.  Hypertensive urgency: Blood pressure stable now on Norvasc and atenolol.  Anxiety: On sertraline at night.  Stable.  Anemia: With expected blood loss from long bone fracture and operative procedure.  Stable.   DVT prophylaxis: enoxaparin (LOVENOX) injection 40 mg Start: 10/13/20 1000 SCDs Start: 10/12/20 1743 SCDs Start: 10/12/20 1203   Code Status: Full code Family Communication: None.  Patient is communicating with family. Disposition Plan: Status is: Inpatient  Remains inpatient appropriate because:Unsafe d/c plan   Dispo: The patient is from: Home              Anticipated d/c is to: SNF              Anticipated d/c date is: 1 day              Patient currently is medically stable to d/c.         Consultants:   Orthopedics  Procedures:   ORIF  Antimicrobials:    None   Subjective: Patient seen and examined.  She just worked with physical therapy.  Pain on mobility.  Otherwise denies any complaints.  She was not sure whether she will have enough support at home and is ready to go to short-term SNF.  Objective: Vitals:   10/13/20 1334 10/13/20 2111 10/13/20 2124 10/14/20 0532  BP: 136/64 (!) 145/69  (!) 146/66  Pulse: 79 (!) 52  63  Resp: 16 17  16   Temp: 98.4 F (36.9 C) 99.5 F (37.5 C)  98.2 F (36.8 C)  TempSrc: Oral     SpO2: 96% (!) 88% 93% 90%  Weight:      Height:        Intake/Output Summary (Last 24 hours) at 10/14/2020 1344 Last data filed at 10/14/2020 0900 Gross per 24 hour  Intake 1320 ml  Output 700 ml  Net 620 ml   Filed Weights   10/12/20 0801  Weight: 83 kg    Examination:  General exam: Appears calm and comfortable On room air.  Sitting in couch. Respiratory system: Clear to auscultation. Respiratory effort normal.  No added sounds. Cardiovascular system: S1 & S2 heard, RRR. No JVD, murmurs, rubs, gallops or clicks. No pedal edema. Gastrointestinal system: Abdomen is nondistended, soft and nontender. No organomegaly or masses felt. Normal bowel sounds heard. Central nervous system: Alert and oriented. No focal neurological deficits. Extremities:  Left lateral thigh incision clean and dry, minimal swelling as appropriate from postop.  Distal neurovascular status intact.   Data Reviewed:  I have personally reviewed following labs and imaging studies  CBC: Recent Labs  Lab 10/12/20 0844 10/13/20 0340 10/14/20 0342  WBC 8.3 10.6* 10.5  NEUTROABS 6.8  --   --   HGB 13.5 10.2* 9.3*  HCT 41.9 31.4* 28.6*  MCV 97.4 97.8 98.6  PLT 230 212 208   Basic Metabolic Panel: Recent Labs  Lab 10/12/20 0844 10/13/20 0340 10/14/20 0342  NA 138 137 139  K 3.9 4.0 4.0  CL 105 104 106  CO2 24 24 26   GLUCOSE 173* 169* 139*  BUN 14 13 20   CREATININE 0.65 0.73 0.75  CALCIUM 9.1 8.5* 9.0   GFR: Estimated  Creatinine Clearance: 61.7 mL/min (by C-G formula based on SCr of 0.75 mg/dL). Liver Function Tests: No results for input(s): AST, ALT, ALKPHOS, BILITOT, PROT, ALBUMIN in the last 168 hours. No results for input(s): LIPASE, AMYLASE in the last 168 hours. No results for input(s): AMMONIA in the last 168 hours. Coagulation Profile: Recent Labs  Lab 10/12/20 0844  INR 1.0   Cardiac Enzymes: No results for input(s): CKTOTAL, CKMB, CKMBINDEX, TROPONINI in the last 168 hours. BNP (last 3 results) No results for input(s): PROBNP in the last 8760 hours. HbA1C: No results for input(s): HGBA1C in the last 72 hours. CBG: No results for input(s): GLUCAP in the last 168 hours. Lipid Profile: No results for input(s): CHOL, HDL, LDLCALC, TRIG, CHOLHDL, LDLDIRECT in the last 72 hours. Thyroid Function Tests: No results for input(s): TSH, T4TOTAL, FREET4, T3FREE, THYROIDAB in the last 72 hours. Anemia Panel: No results for input(s): VITAMINB12, FOLATE, FERRITIN, TIBC, IRON, RETICCTPCT in the last 72 hours. Sepsis Labs: No results for input(s): PROCALCITON, LATICACIDVEN in the last 168 hours.  Recent Results (from the past 240 hour(s))  MRSA PCR Screening     Status: None   Collection Time: 10/12/20  1:01 PM   Specimen: Nasal Mucosa; Nasopharyngeal  Result Value Ref Range Status   MRSA by PCR NEGATIVE NEGATIVE Final    Comment:        The GeneXpert MRSA Assay (FDA approved for NASAL specimens only), is one component of a comprehensive MRSA colonization surveillance program. It is not intended to diagnose MRSA infection nor to guide or monitor treatment for MRSA infections. Performed at [facility], [address], [place], [place]   Respiratory Panel by RT PCR (Flu A&B, Covid) - Nasal Mucosa     Status: None   Collection Time: 10/12/20  1:01 PM   Specimen: Nasal Mucosa; Nasopharyngeal  Result Value Ref Range Status   SARS Coronavirus 2 by RT PCR NEGATIVE  NEGATIVE Final    Comment: (NOTE) SARS-CoV-2 target nucleic acids are NOT DETECTED.  The SARS-CoV-2 RNA is generally detectable in upper respiratoy specimens during the acute phase of infection. The lowest concentration of SARS-CoV-2 viral copies this assay can detect is 131 copies/mL. A negative result does not preclude SARS-Cov-2 infection and should not be used as the sole basis for treatment or other patient management decisions. A negative result may occur with  improper specimen collection/handling, submission of specimen other than nasopharyngeal swab, presence of viral mutation(s) within the areas targeted by this assay, and inadequate number of viral copies (<131 copies/mL). A negative result must be combined with clinical observations, patient history, and epidemiological information. The expected result is Negative.  Fact Sheet for Patients:  11914  Fact Sheet for Healthcare Providers:  10/14/20  This test is no t yet approved or cleared  by the [place] and  has been authorized for detection and/or diagnosis of SARS-CoV-2 by FDA under an Emergency Use Authorization (EUA). This EUA will remain  in effect (meaning this test can be used) for the duration of the COVID-19 declaration under Section 564(b)(1) of the Act, 21 U.S.C. section 360bbb-3(b)(1), unless the authorization is terminated or revoked sooner.     Influenza A by PCR NEGATIVE NEGATIVE Final   Influenza B by PCR NEGATIVE NEGATIVE Final    Comment: (NOTE) The Xpert Xpress SARS-CoV-2/FLU/RSV assay is intended as an aid in  the diagnosis of influenza from Nasopharyngeal swab specimens and  should not be used as a sole basis for treatment. Nasal washings and  aspirates are unacceptable for Xpert Xpress SARS-CoV-2/FLU/RSV  testing.  Fact Sheet for Patients: https://www.moore.com/  Fact Sheet for Healthcare  Providers: https://www.young.biz/  This test is not yet approved or cleared by the [place] FDA and  has been authorized for detection and/or diagnosis of SARS-CoV-2 by  FDA under an Emergency Use Authorization (EUA). This EUA will remain  in effect (meaning this test can be used) for the duration of the  Covid-19 declaration under Section 564(b)(1) of the Act, 21  U.S.C. section 360bbb-3(b)(1), unless the authorization is  terminated or revoked. Performed at [facility], [address], [place], [place] [address]          Radiology Studies: DG C-Arm 1-60 Min-No Report  Result Date: 10/12/2020 CLINICAL DATA:  Operative imaging provided for ORIF of an intertrochanteric left proximal femur fracture. EXAM: OPERATIVE left HIP (WITH PELVIS IF PERFORMED) 4 VIEWS TECHNIQUE: Fluoroscopic spot image(s) were submitted for interpretation post-operatively. COMPARISON:  10/12/2020 at 10 a.m. FINDINGS: Four submitted images show placement of 2 screws supported by a long intramedullary rod reducing the primary fracture components of the intertrochanteric proximal femur fracture into near anatomic alignment. The orthopedic hardware is well-seated. IMPRESSION: Well-positioned proximal left femur intertrochanteric fracture following ORIF. Electronically Signed   By: Amie Portland M.D.   On: 10/12/2020 16:34   DG HIP OPERATIVE UNILAT WITH PELVIS LEFT  Result Date: 10/12/2020 CLINICAL DATA:  Operative imaging provided for ORIF of an intertrochanteric left proximal femur fracture. EXAM: OPERATIVE left HIP (WITH PELVIS IF PERFORMED) 4 VIEWS TECHNIQUE: Fluoroscopic spot image(s) were submitted for interpretation post-operatively. COMPARISON:  10/12/2020 at 10 a.m. FINDINGS: Four submitted images show placement of 2 screws supported by a long intramedullary rod reducing the primary fracture components of the intertrochanteric proximal femur fracture into near anatomic  alignment. The orthopedic hardware is well-seated. IMPRESSION: Well-positioned proximal left femur intertrochanteric fracture following ORIF. Electronically Signed   By: Amie Portland M.D.   On: 10/12/2020 16:34        Scheduled Meds: . amLODipine  5 mg Oral BID  . atenolol  25 mg Oral QHS  . docusate sodium  100 mg Oral BID  . enoxaparin (LOVENOX) injection  40 mg Subcutaneous Q24H  . feeding supplement  237 mL Oral BID BM  . multivitamin with minerals  1 tablet Oral Daily  . sertraline  50 mg Oral QHS   Continuous Infusions: . methocarbamol (ROBAXIN) IV       LOS: 2 days    Time spent: 30 minutes    Dorcas Carrow, MD [facility] Pager [phone]

## 2020-10-14 NOTE — Progress Notes (Signed)
Orthopedic Tech Progress Note Patient Details:  April Tanner 1941-06-07 [id number]  Ortho Devices Ortho Device/Splint Location: applied overhead frame   Post Interventions Patient Tolerated: Well Instructions Provided: Care of device   Jennye Moccasin 10/14/2020, 2:42 PM

## 2020-10-14 NOTE — NC FL2 (Signed)
[place] MEDICAID FL2 LEVEL OF CARE SCREENING TOOL     IDENTIFICATION  Patient Name: April Tanner Birthdate: [birth year] Sex: female Admission Date (Current Location): 10/12/2020  [facility] and [place] Number:  Producer, television/film/video and Address:  [facility],  [address] [place] [address]      Provider Number: [id number]  Attending Physician Name and Address:  Dorcas Carrow, MD  Relative Name and Phone Number:       Current Level of Care: Hospital Recommended Level of Care: Skilled Nursing Facility Prior Approval Number:    Date Approved/Denied:   PASRR Number: [id number]  Discharge Plan: SNF    Current Diagnoses: Patient Active Problem List   Diagnosis Date Noted  . Accelerated hypertension 10/12/2020  . Hyperlipidemia 10/12/2020  . Anxiety 10/12/2020  . Displaced intertrochanteric fracture of left femur, initial encounter for closed fracture (HCC) 10/12/2020    Orientation RESPIRATION BLADDER Height & Weight     Self, Time, Situation, Place  Normal Continent Weight: 183 lb (83 kg) Height:  5\' 5"  (165.1 cm)  BEHAVIORAL SYMPTOMS/MOOD NEUROLOGICAL BOWEL NUTRITION STATUS      Continent Diet (See dc summary)  AMBULATORY STATUS COMMUNICATION OF NEEDS Skin   Extensive Assist Verbally Surgical wounds (Left hip incision)                       Personal Care Assistance Level of Assistance  Bathing, Dressing, Feeding Bathing Assistance: Limited assistance Feeding assistance: Limited assistance Dressing Assistance: Limited assistance     Functional Limitations Info  Sight, Hearing, Speech Sight Info: Impaired Hearing Info: Adequate Speech Info: Adequate    SPECIAL CARE FACTORS FREQUENCY  PT (By licensed PT), OT (By licensed OT)     PT Frequency: 5x/week OT Frequency: 5x/week            Contractures Contractures Info: Not present    Additional Factors Info  Code Status, Allergies Code Status Info: Full Allergies Info: NKA            Current Medications (10/14/2020):  This is the current hospital active medication list Current Facility-Administered Medications  Medication Dose Route Frequency Provider Last Rate Last Admin  . acetaminophen (TYLENOL) tablet 325-650 mg  325-650 mg Oral Q6H PRN 10/16/2020, MD   650 mg at 10/13/20 2024  . alum & mag hydroxide-simeth (MAALOX/MYLANTA) 200-200-20 MG/5ML suspension 30 mL  30 mL Oral Q4H PRN 2025, MD      . amLODipine (NORVASC) tablet 5 mg  5 mg Oral BID Tarry Kos, MD   5 mg at 10/14/20 0945  . atenolol (TENORMIN) tablet 25 mg  25 mg Oral QHS 10/16/20, MD   25 mg at 10/13/20 2017  . docusate sodium (COLACE) capsule 100 mg  100 mg Oral BID 2018, MD   100 mg at 10/14/20 0946  . enoxaparin (LOVENOX) injection 40 mg  40 mg Subcutaneous Q24H 10/16/20, MD   40 mg at 10/14/20 0946  . feeding supplement (ENSURE ENLIVE / ENSURE PLUS) liquid 237 mL  237 mL Oral BID BM 10/16/20, MD   237 mL at 10/14/20 1412  . HYDROmorphone (DILAUDID) injection 0.5-1 mg  0.5-1 mg Intravenous Q4H PRN 10/16/20, MD      . LORazepam (ATIVAN) injection 0.5 mg  0.5 mg Intravenous BID PRN Tarry Kos, MD      . magnesium citrate solution 1 Bottle  1 Bottle Oral  Once PRN Tarry Kos, MD      . menthol-cetylpyridinium (CEPACOL) lozenge 3 mg  1 lozenge Oral PRN Tarry Kos, MD       Or  . phenol (CHLORASEPTIC) mouth spray 1 spray  1 spray Mouth/Throat PRN Tarry Kos, MD      . methocarbamol (ROBAXIN) tablet 500 mg  500 mg Oral Q6H PRN Tarry Kos, MD   500 mg at 10/13/20 2326   Or  . methocarbamol (ROBAXIN) 500 mg in dextrose 5 % 50 mL IVPB  500 mg Intravenous Q6H PRN Tarry Kos, MD      . metoprolol tartrate (LOPRESSOR) injection 5 mg  5 mg Intravenous Q8H PRN Tarry Kos, MD      . multivitamin with minerals tablet 1 tablet  1 tablet Oral Daily Albertine Grates, MD   1 tablet at 10/14/20 0946  . ondansetron (ZOFRAN) tablet 4 mg  4 mg Oral Q6H PRN Tarry Kos, MD       Or  . ondansetron [place]) injection 4 mg  4 mg Intravenous Q6H PRN Tarry Kos, MD      . oxyCODONE (Oxy IR/ROXICODONE) immediate release tablet 10-15 mg  10-15 mg Oral Q4H PRN Tarry Kos, MD   10 mg at 10/14/20 0946  . oxyCODONE (Oxy IR/ROXICODONE) immediate release tablet 5-10 mg  5-10 mg Oral Q4H PRN Tarry Kos, MD   5 mg at 10/13/20 1052  . polyethylene glycol (MIRALAX / GLYCOLAX) packet 17 g  17 g Oral Daily PRN Tarry Kos, MD      . sertraline (ZOLOFT) tablet 50 mg  50 mg Oral QHS Tarry Kos, MD   50 mg at 10/13/20 2018  . sorbitol 70 % solution 30 mL  30 mL Oral Daily PRN Tarry Kos, MD         Discharge Medications: Please see discharge summary for a list of discharge medications.  Relevant Imaging Results:  Relevant Lab Results:   Additional Information SSN: [id number]  Amada Jupiter, LCSW

## 2020-10-14 NOTE — Progress Notes (Signed)
Physical Therapy Treatment Patient Details Name: April Tanner MRN: [id number] DOB: 1941-11-03 Today's Date: 10/14/2020    History of Present Illness Pt is a 79 year old woman who was taking her dog to the vet when she fell over a curb and fractured her L hip. Underwent IM nail. PMH: HTN, HLD, anxiety, urinary incontinence.    PT Comments    Pt progressing very slowly.  Requires incr time to complete basic tasks, requires frequent redirection and multi-modal cues, has difficulty focusing and sequencing. Continue to feel p If ht would benefit from SNF as she is unable to amb at this time. Per TOC team pt dtr prefers to take pt home.  If home will need 24 hour assist, 3in1, HHPT and non-emergency transport home.   Follow Up Recommendations  SNF;Other (comment);Supervision/Assistance - 24 hour (HHPT if dtr takes pt home)     Equipment Recommendations  3in1 (PT);Rolling walker with 5" wheels    Recommendations for Other Services       Precautions / Restrictions Precautions Precautions: Fall Restrictions Weight Bearing Restrictions: No LLE Weight Bearing: Weight bearing as tolerated    Mobility  Bed Mobility Overal bed mobility: Needs Assistance Bed Mobility: Supine to Sit     Supine to sit: Min assist;+2 for physical assistance     General bed mobility comments: incr time, a multi-modal cues for technique, assist with LLE and to elevate trunk   Transfers Overall transfer level: Needs assistance Equipment used: Rolling walker (2 wheeled) Transfers: Sit to/from Stand Sit to Stand: Mod assist;Min assist;+2 physical assistance;+2 safety/equipment Stand pivot transfers: Min assist;+2 physical assistance;+2 safety/equipment       General transfer comment: increased time, pt with anticipatory pain anxiety, cues for hand placement, steadying assist  Ambulation/Gait             General Gait Details: unable to wt shift to UEs and LLE to advance RLE. environment  manipulated to place chair behind pt and prevent fall   Stairs             Wheelchair Mobility    Modified Rankin (Stroke Patients Only)       Balance     Sitting balance-Leahy Scale: Fair     Standing balance support: Bilateral upper extremity supported;During functional activity Standing balance-Leahy Scale: Poor (reliant on UEs )                              Cognition Arousal/Alertness: Awake/alert Behavior During Therapy: WFL for tasks assessed/performed   Area of Impairment: Attention;Following commands;Problem solving                   Current Attention Level: Sustained   Following Commands: Follows one step commands with increased time;Follows multi-step commands inconsistently     Problem Solving: Slow processing;Decreased initiation;Difficulty sequencing;Requires verbal cues;Requires tactile cues General Comments: pt is alert and oriented, requires frequent redirection to task, multi-modal cues and  excessive time to complete basic mobility       Exercises      General Comments        Pertinent Vitals/Pain Pain Assessment: Faces Faces Pain Scale: Hurts even more Pain Location: L hip Pain Descriptors / Indicators: Aching;Sore Pain Intervention(s): Limited activity within patient's tolerance;Monitored during session;Premedicated before session;Repositioned;Ice applied    Home Living                      Prior Function  PT Goals (current goals can now be found in the care plan section) Acute Rehab PT Goals Patient Stated Goal: walk  PT Goal Formulation: With patient Time For Goal Achievement: 10/20/20 Potential to Achieve Goals: Good Progress towards PT goals: Progressing toward goals    Frequency    Min 3X/week      PT Plan Current plan remains appropriate    Co-evaluation              AM-PAC PT "6 Clicks" Mobility   Outcome Measure  Help needed turning from your back to your side  while in a flat bed without using bedrails?: A Lot Help needed moving from lying on your back to sitting on the side of a flat bed without using bedrails?: A Lot Help needed moving to and from a bed to a chair (including a wheelchair)?: A Lot Help needed standing up from a chair using your arms (e.g., wheelchair or bedside chair)?: A Lot Help needed to walk in hospital room?: A Lot Help needed climbing 3-5 steps with a railing? : Total 6 Click Score: 11    End of Session Equipment Utilized During Treatment: Gait belt Activity Tolerance: Patient tolerated treatment well Patient left: with call bell/phone within reach;in chair;with chair alarm set Nurse Communication: Mobility status PT Visit Diagnosis: Difficulty in walking, not elsewhere classified (R26.2);Other abnormalities of gait and mobility (R26.89)     Time: 8563-1497 PT Time Calculation (min) (ACUTE ONLY): 30 min  Charges:  $Therapeutic Activity: 23-37 mins                     Delice Bison, PT  Acute Rehab Dept ([facility]) [phone] Pager [phone]  10/14/2020    [facility] 10/14/2020, 11:30 AM

## 2020-10-14 NOTE — TOC Progression Note (Signed)
Transition of Care [facility]) - Progression Note    Patient Details  Name: April Tanner MRN: [id number] Date of Birth: [birth year]  Transition of Care [facility]) CM/SW Contact  Lennart Pall, LCSW Phone Number: 10/14/2020, 2:48 PM  Clinical Narrative:    Met with pt and daughter today to discuss dc plans.  Pt continues to require much assistance with mobility and ADLs and both are now agreed with plan for SNF/ short term rehab.  Have begun insurance authorization Everlene Balls ref# [id number]).    Expected Discharge Plan: [place] Barriers to Discharge: Continued Medical Work up  Expected Discharge Plan and Services Expected Discharge Plan: [place] In-house Referral: NA Discharge Planning Services: NA Post Acute Care Choice: [place] Living arrangements for the past 2 months: Single Family Home                                       Social Determinants of Health (SDOH) Interventions    Readmission Risk Interventions Readmission Risk Prevention Plan 10/13/2020  Transportation Screening Complete

## 2020-10-14 NOTE — Plan of Care (Signed)

## 2020-10-14 NOTE — Progress Notes (Signed)
purewick placed per patient request, pt has issues with incontinence and frequency and is not moving well with a walker to the bsc, needs much encouragement even to make very slight movements with lower extremities, did start talking about the possibility of needing to go to rehab/snf prior to returning home due to difficulty with movement/ambulation, patient is agreeable with this.

## 2020-10-14 NOTE — Progress Notes (Addendum)
Subjective: 2 Days Post-Op Procedure(s) (LRB): INTRAMEDULLARY (IM) NAIL FEMORAL (Left) Patient reports pain as mild.   Objective: Vital signs in last 24 hours: Temp:  [98.2 F (36.8 C)-99.5 F (37.5 C)] 98.2 F (36.8 C) (10/18 0532) Pulse Rate:  [52-79] 63 (10/18 0532) Resp:  [16-17] 16 (10/18 0532) BP: (136-146)/(64-69) 146/66 (10/18 0532) SpO2:  [88 %-96 %] 90 % (10/18 0532)  Intake/Output from previous day: 10/17 0701 - 10/18 0700 In: 2042.6 [P.O.:1680; I.V.:262.6; IV Piggyback:100] Out: 1125 [Urine:1125] Intake/Output this shift: Total I/O In: -  Out: 200 [Urine:200]  Recent Labs    10/12/20 0844 10/13/20 0340 10/14/20 0342  HGB 13.5 10.2* 9.3*   Recent Labs    10/13/20 0340 10/14/20 0342  WBC 10.6* 10.5  RBC 3.21* 2.90*  HCT 31.4* 28.6*  PLT 212 208   Recent Labs    10/13/20 0340 10/14/20 0342  NA 137 139  K 4.0 4.0  CL 104 106  CO2 24 26  BUN 13 20  CREATININE 0.73 0.75  GLUCOSE 169* 139*  CALCIUM 8.5* 9.0   Recent Labs    10/12/20 0844  INR 1.0    Neurologically intact Neurovascular intact Sensation intact distally Intact pulses distally Dorsiflexion/Plantar flexion intact Incision: dressing C/D/I No cellulitis present Compartment soft     Assessment/Plan: 2 Days Post-Op Procedure(s) (LRB): INTRAMEDULLARY (IM) NAIL FEMORAL (Left) Up with therapy  WBAT LLE ABLA-mild and stable Lovenox/SCDs for dvt ppx D/c dispo per primary team, but appears that she will need SNF F/u with Dr. Roda Shutters 2 weeks post-op for suture removal     Cristie Hem 10/14/2020, 6:41 AM

## 2020-10-15 DIAGNOSIS — S72142A Displaced intertrochanteric fracture of left femur, initial encounter for closed fracture: Secondary | ICD-10-CM | POA: Diagnosis not present

## 2020-10-15 LAB — CBC
HCT: 28.1 % — ABNORMAL LOW (ref 36.0–46.0)
Hemoglobin: 9.1 g/dL — ABNORMAL LOW (ref 12.0–15.0)
MCH: 32.4 pg (ref 26.0–34.0)
MCHC: 32.4 g/dL (ref 30.0–36.0)
MCV: 100 fL (ref 80.0–100.0)
Platelets: 183 10*3/uL (ref 150–400)
RBC: 2.81 MIL/uL — ABNORMAL LOW (ref 3.87–5.11)
RDW: 12.2 % (ref 11.5–15.5)
WBC: 9.8 10*3/uL (ref 4.0–10.5)
nRBC: 0 % (ref 0.0–0.2)

## 2020-10-15 LAB — RESPIRATORY PANEL BY RT PCR (FLU A&B, COVID)
Influenza A by PCR: NEGATIVE
Influenza B by PCR: NEGATIVE
SARS Coronavirus 2 by RT PCR: NEGATIVE

## 2020-10-15 MED ORDER — POLYETHYLENE GLYCOL 3350 17 G PO PACK: 17.0000 g | PACK | Freq: Every day | ORAL | 0 refills | Status: DC | PRN

## 2020-10-15 NOTE — TOC Transition Note (Signed)
Transition of Care [facility]) - CM/SW Discharge Note   Patient Details  Name: April Tanner MRN: [id number] Date of Birth: 1941/05/22  Transition of Care [facility]) CM/SW Contact:  Amada Jupiter, LCSW Phone Number: 10/15/2020, 10:34 AM   Clinical Narrative:    Pt is medically cleared for dc and has accepted a bed offer at [facility] in Room 209. RN to call report to [phone].  Daughter aware. PTAR to transport.   No further TOC needs.  Final next level of care: Skilled Nursing Facility Barriers to Discharge: Barriers Resolved   Patient Goals and CMS Choice Patient states their goals for this hospitalization and ongoing recovery are:: Go home and get stronger CMS Medicare.gov Compare Post Acute Care list provided to:: Patient Choice offered to / list presented to : Patient  Discharge Placement PASRR number recieved: 10/13/20            Patient chooses bed at: [facility] [place] Patient to be transferred to facility by: PTAR Name of family member notified: daughter, Baxter Hire Patient and family notified of of transfer: 10/15/20  Discharge Plan and Services In-house Referral: NA Discharge Planning Services: NA Post Acute Care Choice: Skilled Nursing Facility          DME Arranged: N/A DME Agency: NA       HH Arranged: NA HH Agency: NA        Social Determinants of Health (SDOH) Interventions     Readmission Risk Interventions Readmission Risk Prevention Plan 10/13/2020  Transportation Screening Complete

## 2020-10-15 NOTE — Care Management Important Message (Signed)
Important Message  Patient Details IM Letter given to the Patient Name: April Tanner MRN: [id number] Date of Birth: [birth year]   Medicare Important Message Given:  Yes     Caren Macadam 10/15/2020, 11:10 AM

## 2020-10-15 NOTE — Progress Notes (Signed)
Called report to patient receiving nurse Mia.

## 2020-10-15 NOTE — Discharge Summary (Signed)
Physician Discharge Summary  April Tanner BTD:[id number] DOB: 02-21-1941 DOA: 10/12/2020  PCP: Verlon Au, MD  Admit date: 10/12/2020 Discharge date: 10/15/2020  Admitted From: Home Disposition: Skilled nursing facility  Recommendations for Outpatient Follow-up:  1. Follow up with PCP in 1-2 weeks 2. Please obtain BMP/CBC in one week 3. Follow-up with orthopedics as scheduled  Home Health: Not applicable Equipment/Devices: Not applicable  Discharge Condition: Stable CODE STATUS: Full code Diet recommendation: Regular low-salt  Discharge summary: 79 year old female with history of hypertension, hyperlipidemia presented with mechanical fall, she tripped over a curb while walking her dog.  In the emergency room blood pressure 207 x 99.  Left hip showed comminuted intertrochanteric fracture of the left hip.  Admitted for surgical intervention.   #1 displaced intertrochanteric fracture of the left femur, closed and traumatic: Status post ORIF with intramedullary implant on 10/16.  Improving appropriately postop. Pain management with Tylenol, oxycodone along with laxative. Weightbearing as tolerated.  Continue to work with PT OT.  Refer to inpatient therapies at a SNF. DVT prophylaxis with Lovenox for  14 days. Outpatient orthopedic follow-up.  #2 hypertensive urgency: Blood pressure stable now on Norvasc and atenolol.  #3 anxiety: On sertraline at night.  Stable.  #4 anemia: With expected blood loss from long bone fracture and operative procedure.  Stable.  No indication for intervention now.  #5. right chest wall pain: With no evidence of palpable fracture.  Symptomatic treatment as above.  Patient is stable to transfer to skilled level of care to continue inpatient therapies before returning home.  Discharge Diagnoses:  Principal Problem:   Displaced intertrochanteric fracture of left femur, initial encounter for closed fracture [facility]) Active Problems:    Accelerated hypertension   Hyperlipidemia   Anxiety    Discharge Instructions  Discharge Instructions    Call MD for:  redness, tenderness, or signs of infection (pain, swelling, redness, odor or green/yellow discharge around incision site)   Complete by: As directed    Call MD for:  severe uncontrolled pain   Complete by: As directed    Call MD for:  temperature >100.4   Complete by: As directed    Diet - low sodium heart healthy   Complete by: As directed    Increase activity slowly   Complete by: As directed    No dressing needed   Complete by: As directed    Weight bearing as tolerated   Complete by: As directed      Allergies as of 10/15/2020   No Known Allergies     Medication List    TAKE these medications   acetaminophen 650 MG CR tablet Commonly known as: TYLENOL Take 650 mg by mouth every 8 (eight) hours as needed for pain.   amLODipine 5 MG tablet Commonly known as: NORVASC Take 5 mg by mouth 2 (two) times daily.   atenolol 25 MG tablet Commonly known as: TENORMIN Take 25 mg by mouth at bedtime.   enoxaparin 40 MG/0.4ML injection Commonly known as: LOVENOX Inject 0.4 mLs (40 mg total) into the skin daily for 14 days.   multivitamin tablet Take 1 tablet by mouth daily.   oxyCODONE-acetaminophen 5-325 MG tablet Commonly known as: Percocet Take 1-2 tablets by mouth every 8 (eight) hours as needed for severe pain.   polyethylene glycol 17 g packet Commonly known as: MIRALAX / GLYCOLAX Take 17 g by mouth daily as needed for mild constipation.   sertraline 50 MG tablet Commonly known as: ZOLOFT Take 50  mg by mouth at bedtime.            Discharge Care Instructions  (From admission, onward)         Start     Ordered   10/15/20 0000  No dressing needed        10/15/20 1007   10/12/20 0000  Weight bearing as tolerated        10/12/20 1608          Contact information for follow-up providers    Tarry Kos, MD In 2 weeks.    Specialty: Orthopedic Surgery Why: For suture removal, For wound re-check Contact information: [address] [place] [address] [phone]            Contact information for after-discharge care    Destination    [facility] Preferred SNF .   Service: Skilled Nursing Contact information: [address] [place] [address] [phone]                 No Known Allergies  Consultations:  Orthopedics   Procedures/Studies: DG C-Arm 1-60 Min-No Report  Result Date: 10/12/2020 CLINICAL DATA:  Operative imaging provided for ORIF of an intertrochanteric left proximal femur fracture. EXAM: OPERATIVE left HIP (WITH PELVIS IF PERFORMED) 4 VIEWS TECHNIQUE: Fluoroscopic spot image(s) were submitted for interpretation post-operatively. COMPARISON:  10/12/2020 at 10 a.m. FINDINGS: Four submitted images show placement of 2 screws supported by a long intramedullary rod reducing the primary fracture components of the intertrochanteric proximal femur fracture into near anatomic alignment. The orthopedic hardware is well-seated. IMPRESSION: Well-positioned proximal left femur intertrochanteric fracture following ORIF. Electronically Signed   By: Amie Portland M.D.   On: 10/12/2020 16:34   DG HIP OPERATIVE UNILAT WITH PELVIS LEFT  Result Date: 10/12/2020 CLINICAL DATA:  Operative imaging provided for ORIF of an intertrochanteric left proximal femur fracture. EXAM: OPERATIVE left HIP (WITH PELVIS IF PERFORMED) 4 VIEWS TECHNIQUE: Fluoroscopic spot image(s) were submitted for interpretation post-operatively. COMPARISON:  10/12/2020 at 10 a.m. FINDINGS: Four submitted images show placement of 2 screws supported by a long intramedullary rod reducing the primary fracture components of the intertrochanteric proximal femur fracture into near anatomic alignment. The orthopedic hardware is well-seated. IMPRESSION: Well-positioned proximal left femur  intertrochanteric fracture following ORIF. Electronically Signed   By: Amie Portland M.D.   On: 10/12/2020 16:34   DG Hip Unilat With Pelvis 2-3 Views Left  Result Date: 10/12/2020 CLINICAL DATA:  Pain after fall EXAM: DG HIP (WITH OR WITHOUT PELVIS) 2-3V LEFT COMPARISON:  None. FINDINGS: There is a comminuted displaced fracture in the proximal femur extending through the intertrochanteric region. No dislocation identified. IMPRESSION: Comminuted displaced intertrochanteric left hip fracture. Electronically Signed   By: Gerome Sam III M.D   On: 10/12/2020 10:30   DG Femur 1V Left  Result Date: 10/12/2020 CLINICAL DATA:  Larey Seat on left hip.  Pain. EXAM: LEFT FEMUR 1 VIEW COMPARISON:  None. FINDINGS: A single AP view of the distal half of the femur was obtained. No fractures noted in this region. IMPRESSION: No fracture seen in the distal half of the left femur on a single limited AP view. Electronically Signed   By: Gerome Sam III M.D   On: 10/12/2020 10:29    (Echo, Carotid, EGD, Colonoscopy, ERCP)    Subjective: Patient seen and examined.  She has more of right lateral chest wall pain from fall.  Also has some pain on the left leg but better  than yesterday.  Needs a lot of help to get out of the bed.  Very motivated to do rehab. No other overnight events.   Discharge Exam: Vitals:   10/14/20 2131 10/15/20 0512  BP: (!) 153/111 138/66  Pulse: 74 64  Resp: 18 16  Temp: 99.2 F (37.3 C) 99 F (37.2 C)  SpO2: 94% 93%   Vitals:   10/13/20 2124 10/14/20 0532 10/14/20 2131 10/15/20 0512  BP:  (!) 146/66 (!) 153/111 138/66  Pulse:  63 74 64  Resp:  16 18 16   Temp:  98.2 F (36.8 C) 99.2 F (37.3 C) 99 F (37.2 C)  TempSrc:   Oral Oral  SpO2: 93% 90% 94% 93%  Weight:      Height:        General: Pt is alert, awake, not in acute distress Cardiovascular: RRR, S1/S2 +, no rubs, no gallops Respiratory: CTA bilaterally, no wheezing, no rhonchi Patient does have some  palpable tenderness along the right anterior and lateral chest wall with no palpable fracture or displacement. Abdominal: Soft, NT, ND, bowel sounds + Extremities: no edema, no cyanosis Left leg, left lateral incisions intact and clean.  Minimal swelling as anticipated post procedure.  Some ecchymosis around the incision.    The results of significant diagnostics from this hospitalization (including imaging, microbiology, ancillary and laboratory) are listed below for reference.     Microbiology: Recent Results (from the past 240 hour(s))  MRSA PCR Screening     Status: None   Collection Time: 10/12/20  1:01 PM   Specimen: Nasal Mucosa; Nasopharyngeal  Result Value Ref Range Status   MRSA by PCR NEGATIVE NEGATIVE Final    Comment:        The GeneXpert MRSA Assay (FDA approved for NASAL specimens only), is one component of a comprehensive MRSA colonization surveillance program. It is not intended to diagnose MRSA infection nor to guide or monitor treatment for MRSA infections. Performed at [facility], [address] [place], [place] [id number]   Respiratory Panel by RT PCR (Flu A&B, Covid) - Nasal Mucosa     Status: None   Collection Time: 10/12/20  1:01 PM   Specimen: Nasal Mucosa; Nasopharyngeal  Result Value Ref Range Status   SARS Coronavirus 2 by RT PCR NEGATIVE NEGATIVE Final    Comment: (NOTE) SARS-CoV-2 target nucleic acids are NOT DETECTED.  The SARS-CoV-2 RNA is generally detectable in upper respiratoy specimens during the acute phase of infection. The lowest concentration of SARS-CoV-2 viral copies this assay can detect is 131 copies/mL. A negative result does not preclude SARS-Cov-2 infection and should not be used as the sole basis for treatment or other patient management decisions. A negative result may occur with  improper specimen collection/handling, submission of specimen other than nasopharyngeal swab, presence of viral mutation(s)  within the areas targeted by this assay, and inadequate number of viral copies (<131 copies/mL). A negative result must be combined with clinical observations, patient history, and epidemiological information. The expected result is Negative.  Fact Sheet for Patients:  https://www.moore.com/https://www.fda.gov/media/142436/download  Fact Sheet for Healthcare Providers:  https://www.young.biz/https://www.fda.gov/media/142435/download  This test is no t yet approved or cleared by the [place] FDA and  has been authorized for detection and/or diagnosis of SARS-CoV-2 by FDA under an Emergency Use Authorization (EUA). This EUA will remain  in effect (meaning this test can be used) for the duration of the COVID-19 declaration under Section 564(b)(1) of the Act, 21 U.S.C. section 360bbb-3(b)(1),  unless the authorization is terminated or revoked sooner.     Influenza A by PCR NEGATIVE NEGATIVE Final   Influenza B by PCR NEGATIVE NEGATIVE Final    Comment: (NOTE) The Xpert Xpress SARS-CoV-2/FLU/RSV assay is intended as an aid in  the diagnosis of influenza from Nasopharyngeal swab specimens and  should not be used as a sole basis for treatment. Nasal washings and  aspirates are unacceptable for Xpert Xpress SARS-CoV-2/FLU/RSV  testing.  Fact Sheet for Patients: https://www.moore.com/  Fact Sheet for Healthcare Providers: https://www.young.biz/  This test is not yet approved or cleared by the [place] FDA and  has been authorized for detection and/or diagnosis of SARS-CoV-2 by  FDA under an Emergency Use Authorization (EUA). This EUA will remain  in effect (meaning this test can be used) for the duration of the  Covid-19 declaration under Section 564(b)(1) of the Act, 21  U.S.C. section 360bbb-3(b)(1), unless the authorization is  terminated or revoked. Performed at [facility], [address], [place], [place] [address]      Labs: BNP (last 3  results) No results for input(s): BNP in the last 8760 hours. Basic Metabolic Panel: Recent Labs  Lab 10/12/20 0844 10/13/20 0340 10/14/20 0342  NA 138 137 139  K 3.9 4.0 4.0  CL 105 104 106  CO2 GLUCOSE 173* 169* 139*  BUN CREATININE 0.65 0.73 0.75  CALCIUM 9.1 8.5* 9.0   Liver Function Tests: No results for input(s): AST, ALT, ALKPHOS, BILITOT, PROT, ALBUMIN in the last 168 hours. No results for input(s): LIPASE, AMYLASE in the last 168 hours. No results for input(s): AMMONIA in the last 168 hours. CBC: Recent Labs  Lab 10/12/20 0844 10/13/20 0340 10/14/20 0342 10/15/20 0332  WBC 8.3 10.6* 10.5 9.8  NEUTROABS 6.8  --   --   --   HGB 13.5 10.2* 9.3* 9.1*  HCT 41.9 31.4* 28.6* 28.1*  MCV 97.4 97.8 98.6 100.0  PLT 230 212 208 183   Cardiac Enzymes: No results for input(s): CKTOTAL, CKMB, CKMBINDEX, TROPONINI in the last 168 hours. BNP: Invalid input(s): POCBNP CBG: No results for input(s): GLUCAP in the last 168 hours. D-Dimer No results for input(s): DDIMER in the last 72 hours. Hgb A1c Recent Labs    10/14/20 0342  HGBA1C 4.8   Lipid Profile No results for input(s): CHOL, HDL, LDLCALC, TRIG, CHOLHDL, LDLDIRECT in the last 72 hours. Thyroid function studies No results for input(s): TSH, T4TOTAL, T3FREE, THYROIDAB in the last 72 hours.  Invalid input(s): FREET3 Anemia work up No results for input(s): VITAMINB12, FOLATE, FERRITIN, TIBC, IRON, RETICCTPCT in the last 72 hours. Urinalysis No results found for: COLORURINE, APPEARANCEUR, LABSPEC, PHURINE, GLUCOSEU, HGBUR, BILIRUBINUR, KETONESUR, PROTEINUR, UROBILINOGEN, NITRITE, LEUKOCYTESUR Sepsis Labs Invalid input(s): PROCALCITONIN,  WBC,  LACTICIDVEN Microbiology Recent Results (from the past 240 hour(s))  MRSA PCR Screening     Status: None   Collection Time: 10/12/20  1:01 PM   Specimen: Nasal Mucosa; Nasopharyngeal  Result Value Ref Range Status   MRSA by PCR NEGATIVE NEGATIVE  Final    Comment:        The GeneXpert MRSA Assay (FDA approved for NASAL specimens only), is one component of a comprehensive MRSA colonization surveillance program. It is not intended to diagnose MRSA infection nor to guide or monitor treatment for MRSA infections. Performed at [facility], [address], [place], [place] [address]   Respiratory Panel by RT  PCR (Flu A&B, Covid) - Nasal Mucosa     Status: None   Collection Time: 10/12/20  1:01 PM   Specimen: Nasal Mucosa; Nasopharyngeal  Result Value Ref Range Status   SARS Coronavirus 2 by RT PCR NEGATIVE NEGATIVE Final    Comment: (NOTE) SARS-CoV-2 target nucleic acids are NOT DETECTED.  The SARS-CoV-2 RNA is generally detectable in upper respiratoy specimens during the acute phase of infection. The lowest concentration of SARS-CoV-2 viral copies this assay can detect is 131 copies/mL. A negative result does not preclude SARS-Cov-2 infection and should not be used as the sole basis for treatment or other patient management decisions. A negative result may occur with  improper specimen collection/handling, submission of specimen other than nasopharyngeal swab, presence of viral mutation(s) within the areas targeted by this assay, and inadequate number of viral copies (<131 copies/mL). A negative result must be combined with clinical observations, patient history, and epidemiological information. The expected result is Negative.  Fact Sheet for Patients:  https://www.moore.com/  Fact Sheet for Healthcare Providers:  https://www.young.biz/  This test is no t yet approved or cleared by the [place] FDA and  has been authorized for detection and/or diagnosis of SARS-CoV-2 by FDA under an Emergency Use Authorization (EUA). This EUA will remain  in effect (meaning this test can be used) for the duration of the COVID-19 declaration under Section 564(b)(1) of the  Act, 21 U.S.C. section 360bbb-3(b)(1), unless the authorization is terminated or revoked sooner.     Influenza A by PCR NEGATIVE NEGATIVE Final   Influenza B by PCR NEGATIVE NEGATIVE Final    Comment: (NOTE) The Xpert Xpress SARS-CoV-2/FLU/RSV assay is intended as an aid in  the diagnosis of influenza from Nasopharyngeal swab specimens and  should not be used as a sole basis for treatment. Nasal washings and  aspirates are unacceptable for Xpert Xpress SARS-CoV-2/FLU/RSV  testing.  Fact Sheet for Patients: https://www.moore.com/  Fact Sheet for Healthcare Providers: https://www.young.biz/  This test is not yet approved or cleared by the [place] FDA and  has been authorized for detection and/or diagnosis of SARS-CoV-2 by  FDA under an Emergency Use Authorization (EUA). This EUA will remain  in effect (meaning this test can be used) for the duration of the  Covid-19 declaration under Section 564(b)(1) of the Act, 21  U.S.C. section 360bbb-3(b)(1), unless the authorization is  terminated or revoked. Performed at [facility], [address], [place], [place] [address]      Time coordinating discharge:  35 minutes  SIGNED:   Dorcas Carrow, MD  [facility] 10/15/2020, 10:07 AM

## 2020-10-29 ENCOUNTER — Ambulatory Visit (INDEPENDENT_AMBULATORY_CARE_PROVIDER_SITE_OTHER): Payer: Medicare HMO | Admitting: Orthopaedic Surgery

## 2020-10-29 ENCOUNTER — Ambulatory Visit (INDEPENDENT_AMBULATORY_CARE_PROVIDER_SITE_OTHER): Payer: Medicare HMO

## 2020-10-29 ENCOUNTER — Encounter: Payer: Self-pay | Admitting: Orthopaedic Surgery

## 2020-10-29 DIAGNOSIS — S72002A Fracture of unspecified part of neck of left femur, initial encounter for closed fracture: Secondary | ICD-10-CM

## 2020-10-29 NOTE — Progress Notes (Signed)
   Post-Op Visit Note   Patient: April Tanner           Date of Birth: February 05, 1941           MRN: [id number] Visit Date: 10/29/2020 PCP: Verlon Au, MD   Assessment & Plan:  Chief Complaint:  Chief Complaint  Patient presents with  . Left Hip - Pain   Visit Diagnoses:  1. Closed fracture of left hip, initial encounter [facility])     Plan: Shalanda is 2 weeks status post IM nail left intertrochanteric fracture.  [facility].  She is doing physical therapy.  She has some expected discomfort and weakness and pain with exertion.  Surgical scars are healed without any signs of infection.  Staples removed today.  Her x-rays demonstrate stable fixation alignment without any complications.  At this point she should continue to do her rehab with PT.  She can discontinue Lovenox and take baby aspirin twice a day for the next 4 weeks for DVT prophylaxis.  Recheck in 4 weeks with two-view x-rays of the left hip.  Follow-Up Instructions: Return in about 4 weeks (around 11/26/2020).   Orders:  Orders Placed This Encounter  Procedures  . XR FEMUR MIN 2 VIEWS LEFT   No orders of the defined types were placed in this encounter.   Imaging: XR FEMUR MIN 2 VIEWS LEFT  Result Date: 10/29/2020 Stable fixation alignment of left intertrochanteric fracture   PMFS History: Patient Active Problem List   Diagnosis Date Noted  . Accelerated hypertension 10/12/2020  . Hyperlipidemia 10/12/2020  . Anxiety 10/12/2020  . Displaced intertrochanteric fracture of left femur, initial encounter for closed fracture (HCC) 10/12/2020   Past Medical History:  Diagnosis Date  . Hypertension     History reviewed. No pertinent family history.  Past Surgical History:  Procedure Laterality Date  . ABDOMINAL HYSTERECTOMY    . FEMUR IM NAIL Left 10/12/2020   Procedure: INTRAMEDULLARY (IM) NAIL FEMORAL;  Surgeon: Tarry Kos, MD;  Location: [facility] ORS;  Service: Orthopedics;   Laterality: Left;  . THYROIDECTOMY     Social History   Occupational History  . Not on file  Tobacco Use  . Smoking status: Never Smoker  . Smokeless tobacco: Never Used  Vaping Use  . Vaping Use: Never used  Substance and Sexual Activity  . Alcohol use: Yes    Comment: social drinker  . Drug use: Never  . Sexual activity: Not on file

## 2020-10-31 ENCOUNTER — Telehealth: Payer: Self-pay

## 2020-10-31 NOTE — Telephone Encounter (Signed)
Patient called stating that she her left leg is swollen and stiff from her left knee down to her left ankle. Stated that it is painful to move her left leg.  Left hip surgery on 10/12/2020.  Would like a CB to discuss?  Cb# [phone].  Please advise.  Thank you.

## 2020-10-31 NOTE — Telephone Encounter (Signed)
I left voicemail.

## 2020-11-05 ENCOUNTER — Telehealth: Payer: Self-pay

## 2020-11-05 NOTE — Telephone Encounter (Signed)
Advised and stated understanding. He stated the proximal area soft tissue is in reference to an area around the incision that is raised. Like a fatty tissue. He stated he wouldn't call it swelling. He stated he didn't see any signs of infection. He stated it has been like that per the pt for a couple weeks.

## 2020-11-05 NOTE — Telephone Encounter (Signed)
Threasa Alpha from [facility] calling asking if patient is weight bearing? Also proximal area soft tissue 2x2x1 His [phone]

## 2020-11-05 NOTE — Telephone Encounter (Signed)
Ok sounds good.  We'll check it when she comes back.

## 2020-11-05 NOTE — Telephone Encounter (Signed)
Weight-bear as tolerated.  What does proximal area soft tissue refer to

## 2020-11-06 NOTE — Telephone Encounter (Signed)
Lvm informing therapist

## 2020-11-26 ENCOUNTER — Ambulatory Visit (INDEPENDENT_AMBULATORY_CARE_PROVIDER_SITE_OTHER): Payer: Medicare HMO

## 2020-11-26 ENCOUNTER — Encounter: Payer: Self-pay | Admitting: Orthopaedic Surgery

## 2020-11-26 ENCOUNTER — Other Ambulatory Visit: Payer: Self-pay

## 2020-11-26 ENCOUNTER — Ambulatory Visit (INDEPENDENT_AMBULATORY_CARE_PROVIDER_SITE_OTHER): Payer: Medicare HMO | Admitting: Orthopaedic Surgery

## 2020-11-26 VITALS — Ht 65.0 in | Wt 183.0 lb

## 2020-11-26 DIAGNOSIS — S72002A Fracture of unspecified part of neck of left femur, initial encounter for closed fracture: Secondary | ICD-10-CM | POA: Diagnosis not present

## 2020-11-26 MED ORDER — TRAMADOL HCL 50 MG PO TABS: 50.0000 mg | ORAL_TABLET | Freq: Two times a day (BID) | ORAL | 0 refills | Status: DC | PRN

## 2020-11-26 NOTE — Progress Notes (Signed)
   Post-Op Visit Note   Patient: April Tanner           Date of Birth: 1941-02-16           MRN: [id number] Visit Date: 11/26/2020 PCP: Verlon Au, MD   Assessment & Plan:  Chief Complaint:  Chief Complaint  Patient presents with  . Left Hip - Pain, Follow-up    Left IM nailing of intertroch fracture 10/12/2020   Visit Diagnoses:  1. Closed fracture of left hip, initial encounter [facility])     Plan:   April Tanner is 6 weeks status post left IM nail intertrochanteric fracture.  Overall doing well walking with a rolling walker.  She has increased pain at nighttime.  Currently taking over-the-counter Tylenol or Motrin as needed.  Pain is 3 out of 10 at worst.  She is currently sleeping in a recliner.  She will have short spurts of pain.  Surgical scars are all fully healed.  Has some mild swelling to the upper scar.  No signs of infection.  X-rays demonstrate stable fixation and continued fracture healing and consolidation.  No hardware complications.  From my standpoint she is recovering well.  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  I think this is fine for her to do as long as she remains sedentary.  I would like to recheck her in 6 weeks with two-view x-rays of the left hip.  Questions encouraged and answered.  Handicap placard provided today.  Follow-Up Instructions: Return in about 6 weeks (around 01/07/2021).   Orders:  Orders Placed This Encounter  Procedures  . XR FEMUR MIN 2 VIEWS LEFT   Meds ordered this encounter  Medications  . traMADol (ULTRAM) 50 MG tablet    Sig: Take 1-2 tablets (50-100 mg total) by mouth 2 (two) times daily as needed.    Dispense:  60 tablet    Refill:  0    Imaging: XR FEMUR MIN 2 VIEWS LEFT  Result Date: 11/26/2020 Stable alignment and fixation of intertrochanteric fracture.  There is fracture consolidation and callus formation.   PMFS History: Patient Active Problem List   Diagnosis  Date Noted  . Closed fracture of left hip (HCC) 11/26/2020  . Accelerated hypertension 10/12/2020  . Hyperlipidemia 10/12/2020  . Anxiety 10/12/2020  . Displaced intertrochanteric fracture of left femur, initial encounter for closed fracture (HCC) 10/12/2020   Past Medical History:  Diagnosis Date  . Hypertension     History reviewed. No pertinent family history.  Past Surgical History:  Procedure Laterality Date  . ABDOMINAL HYSTERECTOMY    . FEMUR IM NAIL Left 10/12/2020   Procedure: INTRAMEDULLARY (IM) NAIL FEMORAL;  Surgeon: Tarry Kos, MD;  Location: [facility];  Service: Orthopedics;  Laterality: Left;  . THYROIDECTOMY     Social History   Occupational History  . Not on file  Tobacco Use  . Smoking status: Never Smoker  . Smokeless tobacco: Never Used  Vaping Use  . Vaping Use: Never used  Substance and Sexual Activity  . Alcohol use: Yes    Comment: social drinker  . Drug use: Never  . Sexual activity: Not on file

## 2020-12-03 ENCOUNTER — Telehealth: Payer: Self-pay | Admitting: Orthopaedic Surgery

## 2020-12-03 ENCOUNTER — Other Ambulatory Visit: Payer: Self-pay | Admitting: Family Medicine

## 2020-12-03 DIAGNOSIS — M7989 Other specified soft tissue disorders: Secondary | ICD-10-CM

## 2020-12-03 NOTE — Telephone Encounter (Signed)
Received FMLA/Disability form    Forwarding to [employer] today

## 2020-12-04 ENCOUNTER — Other Ambulatory Visit: Payer: Self-pay | Admitting: Family Medicine

## 2020-12-04 ENCOUNTER — Ambulatory Visit
Admission: RE | Admit: 2020-12-04 | Discharge: 2020-12-04 | Disposition: A | Discharge status: Home / Self Care | Payer: Medicare HMO | Source: Ambulatory Visit | Attending: Family Medicine | Admitting: Family Medicine

## 2020-12-04 DIAGNOSIS — M7989 Other specified soft tissue disorders: Secondary | ICD-10-CM

## 2020-12-05 ENCOUNTER — Ambulatory Visit: Payer: Medicare HMO | Admitting: Orthopaedic Surgery

## 2020-12-11 ENCOUNTER — Telehealth: Payer: Self-pay

## 2020-12-11 NOTE — Telephone Encounter (Signed)
Pt called very upset about her disability form not being completed yet. I was able to locate the form that was sent back here for Dr. Roda Shutters to sign. Faxed the completed form back to [employer] and advised the patient. Sent the completed form back to [employer] as well,

## 2020-12-13 ENCOUNTER — Telehealth: Payer: Self-pay | Admitting: Orthopaedic Surgery

## 2020-12-13 NOTE — Telephone Encounter (Signed)
April Tanner with [facility] called asking that we call back with CPT code for the procedure done on 10/12/20  [facility] CB# [phone]

## 2020-12-25 NOTE — Telephone Encounter (Signed)
Cpt code is 27245.  IC Mary and she was out of the office.  Unable to leave a voice mail.

## 2021-01-07 ENCOUNTER — Ambulatory Visit (INDEPENDENT_AMBULATORY_CARE_PROVIDER_SITE_OTHER): Payer: Medicare HMO

## 2021-01-07 ENCOUNTER — Encounter: Payer: Self-pay | Admitting: Orthopaedic Surgery

## 2021-01-07 ENCOUNTER — Ambulatory Visit: Payer: Medicare HMO | Admitting: Orthopaedic Surgery

## 2021-01-07 VITALS — Ht 65.0 in | Wt 183.0 lb

## 2021-01-07 DIAGNOSIS — S72002A Fracture of unspecified part of neck of left femur, initial encounter for closed fracture: Secondary | ICD-10-CM

## 2021-01-07 NOTE — Progress Notes (Signed)
   Post-Op Visit Note   Patient: April Tanner           Date of Birth: Oct 23, 1941           MRN: [id number] Visit Date: 01/07/2021 PCP: Verlon Au, MD   Assessment & Plan:  Chief Complaint:  Chief Complaint  Patient presents with  . Left Hip - Follow-up    Left hip IM nailing on 10/12/2020   Visit Diagnoses:  1. Closed fracture of left hip, initial encounter [facility])     Plan:   April Tanner is 3 months status post IM nail left subtrochanteric femur fracture.  Doing well overall.  Ambulating with a cane.  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  She is driving.  Wants to get back onto her trike.  Has some complaints about lower leg pain and swelling.  Surgical incisions are all fully healed.  No thigh pain or hip pain with range of motion.  She does have pitting edema in the lower leg.  X-rays demonstrate full fracture healing.  April Tanner has progressed very quickly and very well.  I have recommended compression socks to help with the pitting edema.  She will continue to do her strengthening exercises.  Activity as tolerated.  Follow-up as needed.  Follow-Up Instructions: Return if symptoms worsen or fail to improve.   Orders:  Orders Placed This Encounter  Procedures  . XR FEMUR MIN 2 VIEWS LEFT   No orders of the defined types were placed in this encounter.   Imaging: XR FEMUR MIN 2 VIEWS LEFT  Result Date: 01/07/2021 Stable fixation.  Fracture has demonstrated consolidation and healing.   PMFS History: Patient Active Problem List   Diagnosis Date Noted  . Closed fracture of left hip (HCC) 11/26/2020  . Accelerated hypertension 10/12/2020  . Hyperlipidemia 10/12/2020  . Anxiety 10/12/2020  . Displaced intertrochanteric fracture of left femur, initial encounter for closed fracture (HCC) 10/12/2020   Past Medical History:  Diagnosis Date  . Hypertension     History reviewed. No pertinent family history.  Past Surgical History:   Procedure Laterality Date  . ABDOMINAL HYSTERECTOMY    . FEMUR IM NAIL Left 10/12/2020   Procedure: INTRAMEDULLARY (IM) NAIL FEMORAL;  Surgeon: Tarry Kos, MD;  Location: [facility];  Service: Orthopedics;  Laterality: Left;  . THYROIDECTOMY     Social History   Occupational History  . Not on file  Tobacco Use  . Smoking status: Never Smoker  . Smokeless tobacco: Never Used  Vaping Use  . Vaping Use: Never used  Substance and Sexual Activity  . Alcohol use: Yes    Comment: social drinker  . Drug use: Never  . Sexual activity: Not on file

## 2021-01-30 ENCOUNTER — Other Ambulatory Visit (HOSPITAL_COMMUNITY): Payer: Self-pay | Admitting: Nurse Practitioner

## 2021-01-30 DIAGNOSIS — R600 Localized edema: Secondary | ICD-10-CM

## 2021-02-11 ENCOUNTER — Ambulatory Visit (HOSPITAL_COMMUNITY): Payer: Medicare HMO

## 2021-08-14 ENCOUNTER — Other Ambulatory Visit: Payer: Self-pay | Admitting: Student in an Organized Health Care Education/Training Program

## 2021-08-14 DIAGNOSIS — Z8781 Personal history of (healed) traumatic fracture: Secondary | ICD-10-CM

## 2022-02-06 ENCOUNTER — Inpatient Hospital Stay: Admission: RE | Admit: 2022-02-06 | Payer: Medicare HMO | Source: Ambulatory Visit

## 2022-02-12 IMAGING — RF DG C-ARM 1-60 MIN-NO REPORT
1 series · 4 of 4 positions shown · non-contrast
Comparison: 10/12/2020 at 10 a.m.

CLINICAL DATA: Operative imaging provided for ORIF of an
intertrochanteric left proximal femur fracture.

EXAM:
OPERATIVE left HIP (WITH PELVIS IF PERFORMED) 4 VIEWS
TECHNIQUE: Fluoroscopic spot image(s) were submitted for interpretation
post-operatively.

[Series 1: unknown protocol · 0.14mm/px · 4 of 4 slices shown]
[im 1/4]
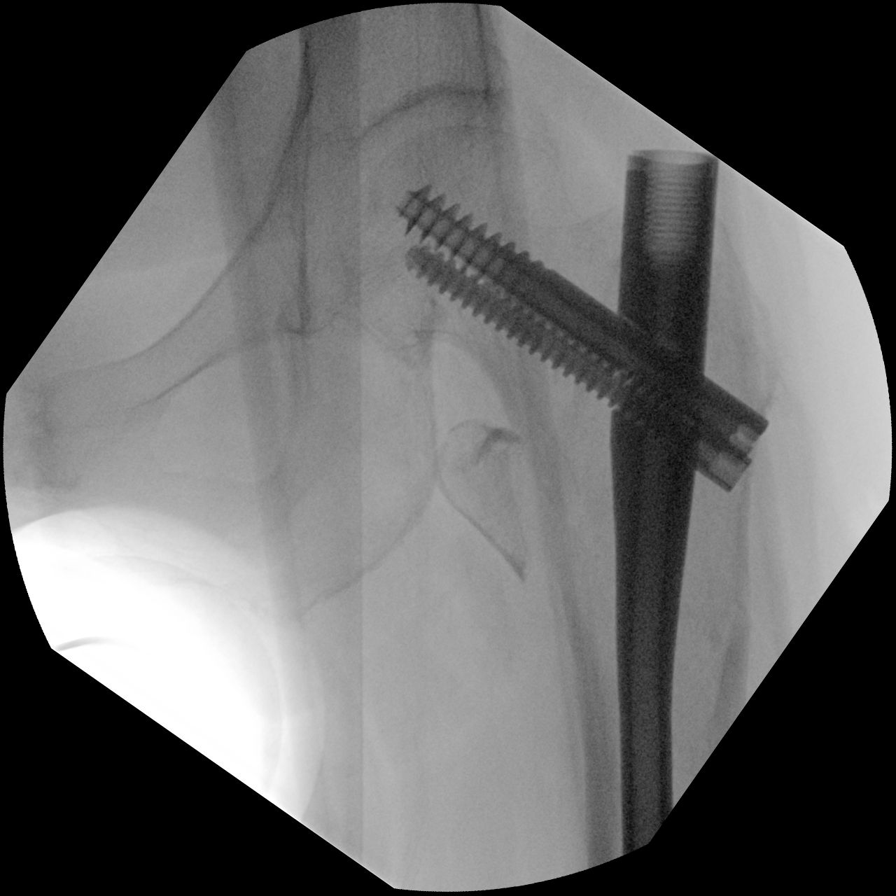
[im 2/4]
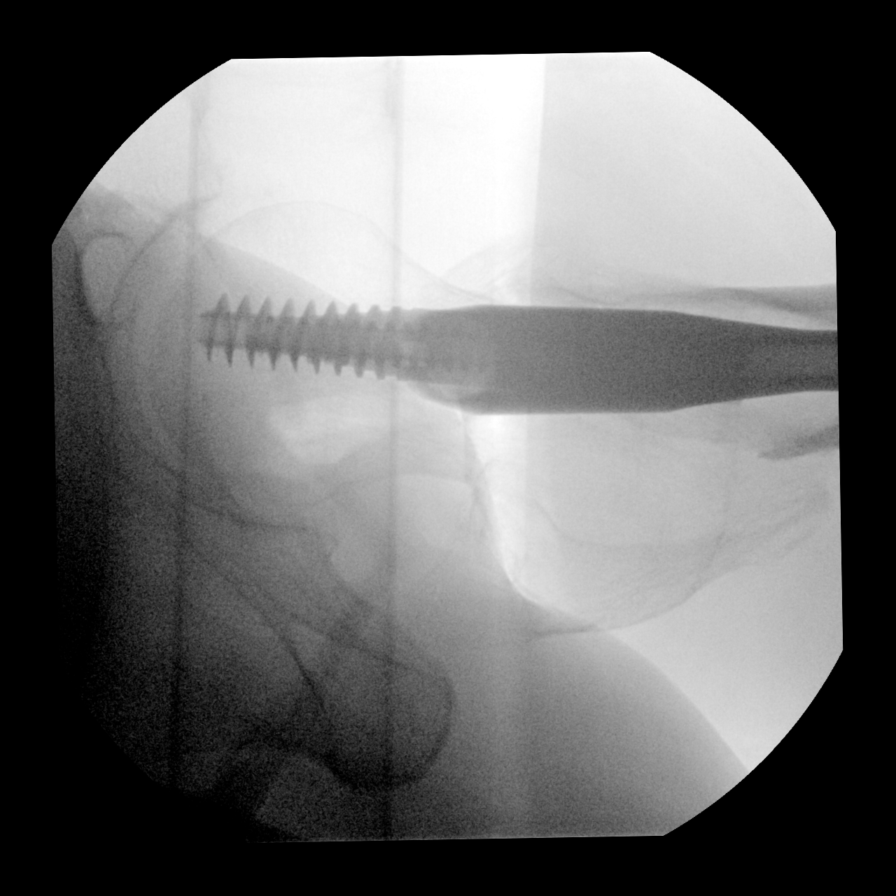
[im 3/4]
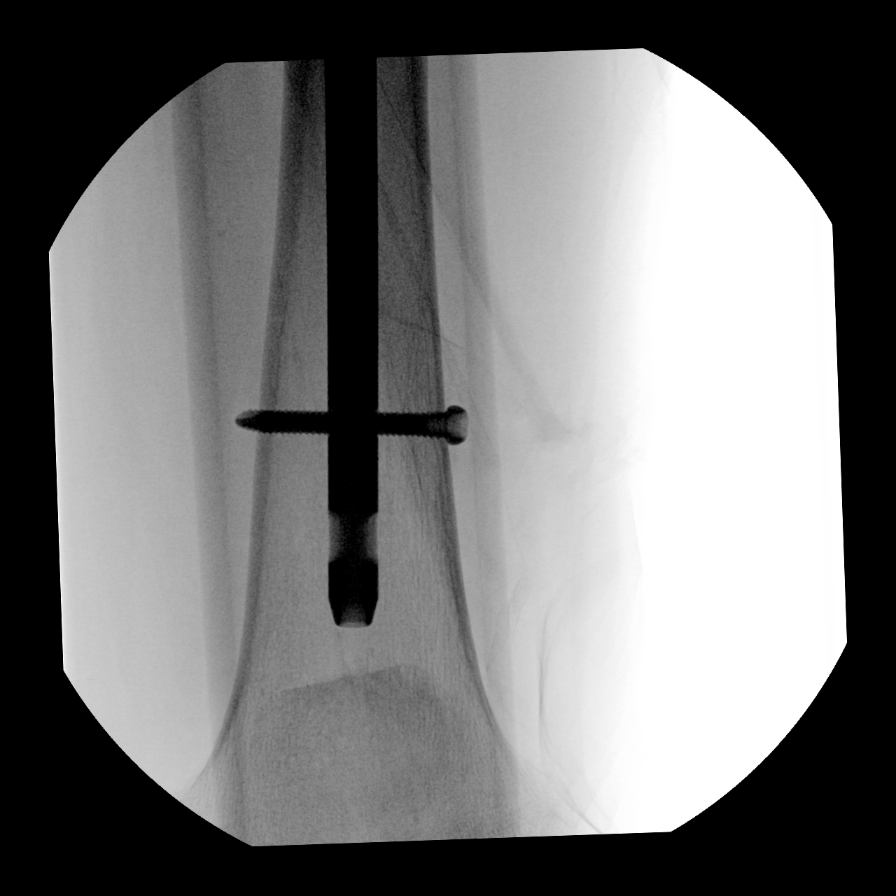
[im 4/4]
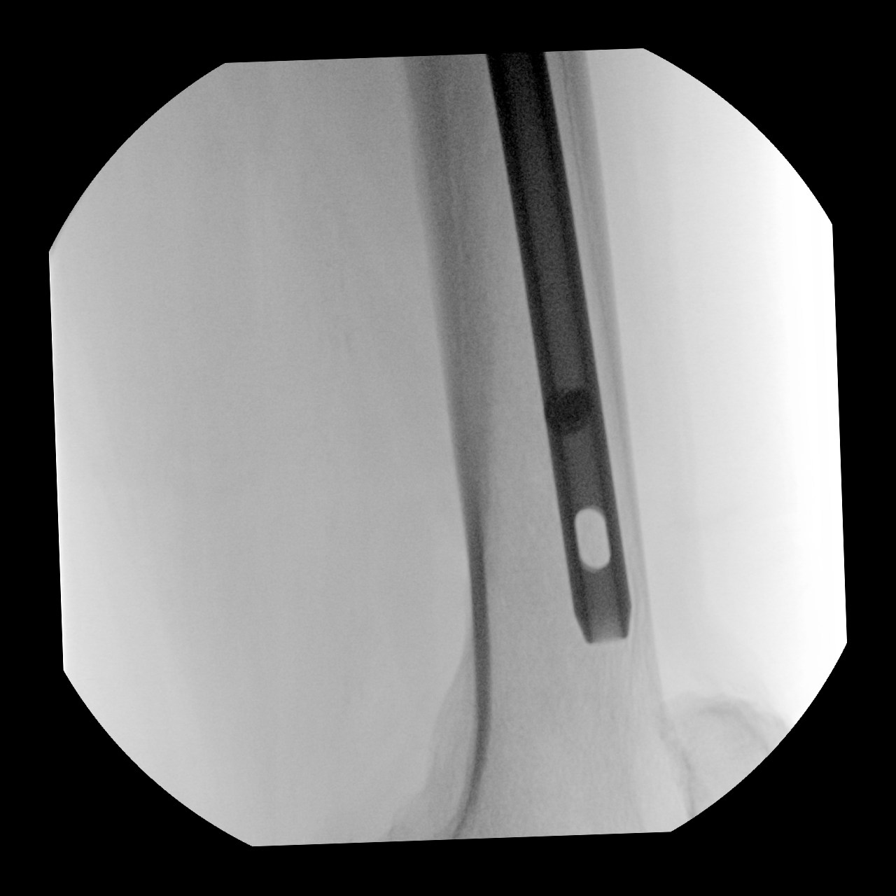

[4 of 4 positions shown; findings below may reference images not displayed]

FINDINGS: Four submitted images show placement of 2 screws supported by a long
intramedullary rod reducing the primary fracture components of the
intertrochanteric proximal femur fracture into near anatomic
alignment. The orthopedic hardware is well-seated.
IMPRESSION: Well-positioned proximal left femur intertrochanteric fracture
following ORIF.

## 2022-02-12 IMAGING — RF DG HIP (WITH PELVIS) OPERATIVE*L*
1 series · 4 of 4 positions shown · non-contrast
Comparison: 10/12/2020 at 10 a.m.

CLINICAL DATA: Operative imaging provided for ORIF of an
intertrochanteric left proximal femur fracture.

EXAM:
OPERATIVE left HIP (WITH PELVIS IF PERFORMED) 4 VIEWS
TECHNIQUE: Fluoroscopic spot image(s) were submitted for interpretation
post-operatively.

[Series 1: unknown protocol · 0.14mm/px · 4 of 4 slices shown]
[im 1/4]
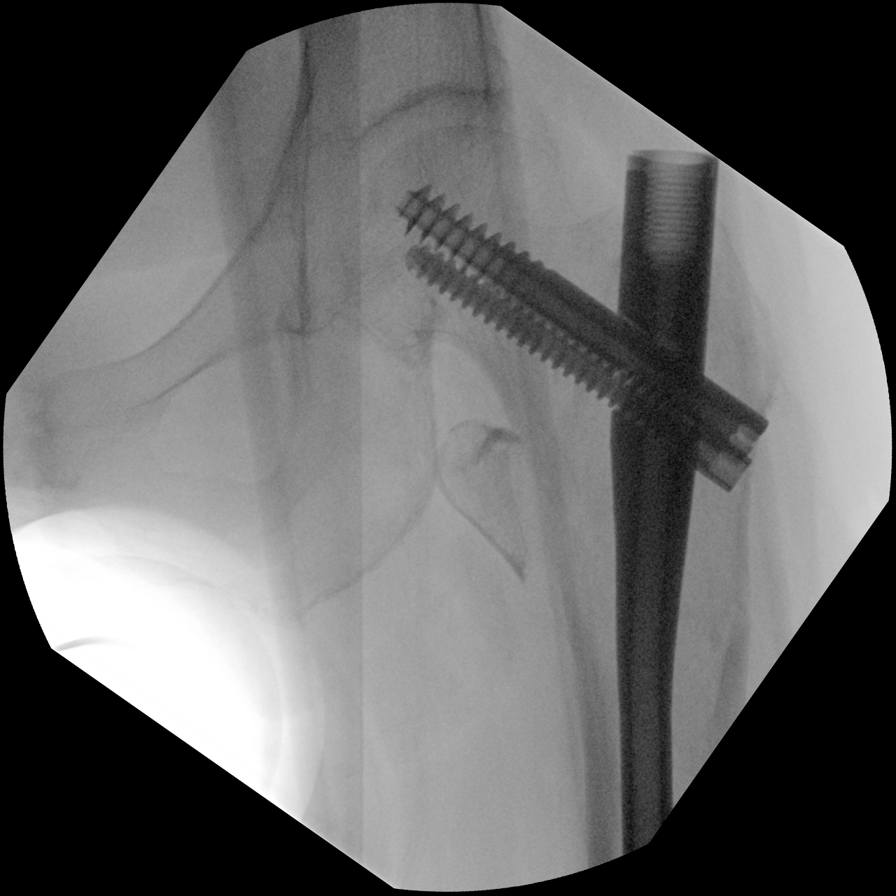
[im 2/4]
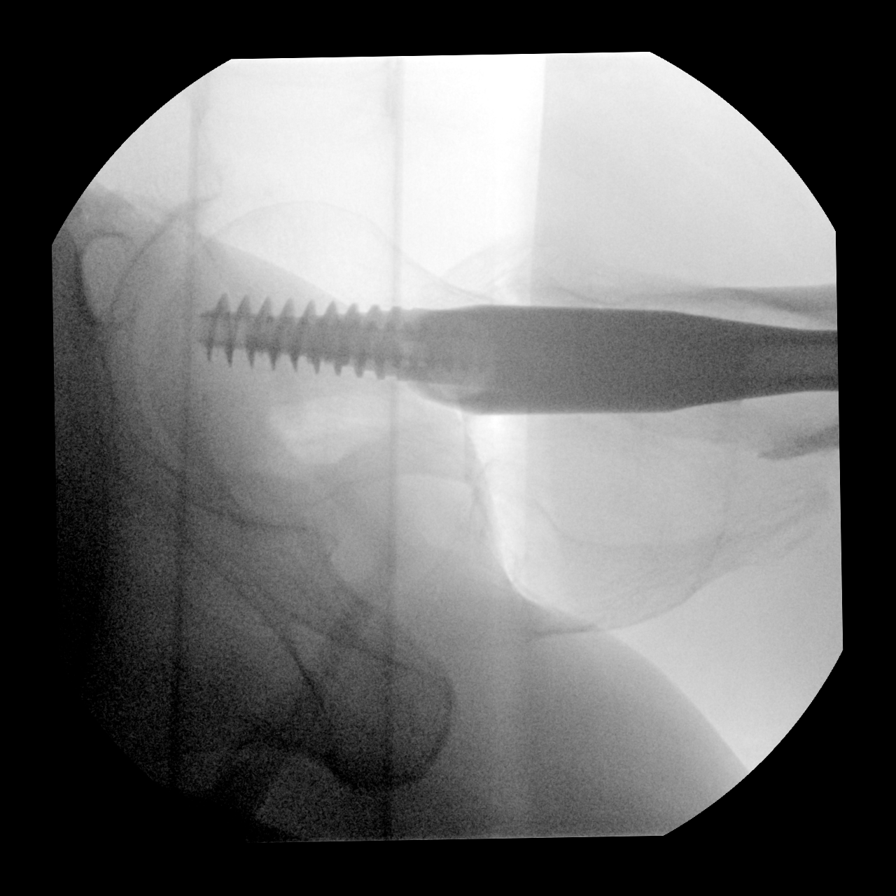
[im 3/4]
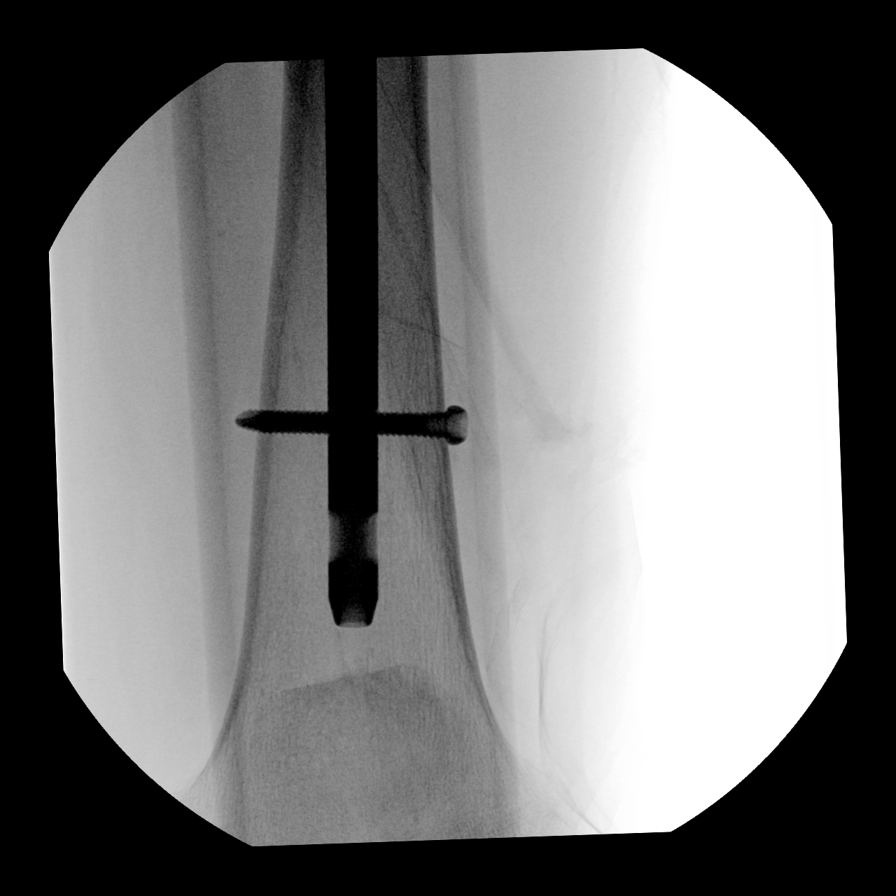
[im 4/4]
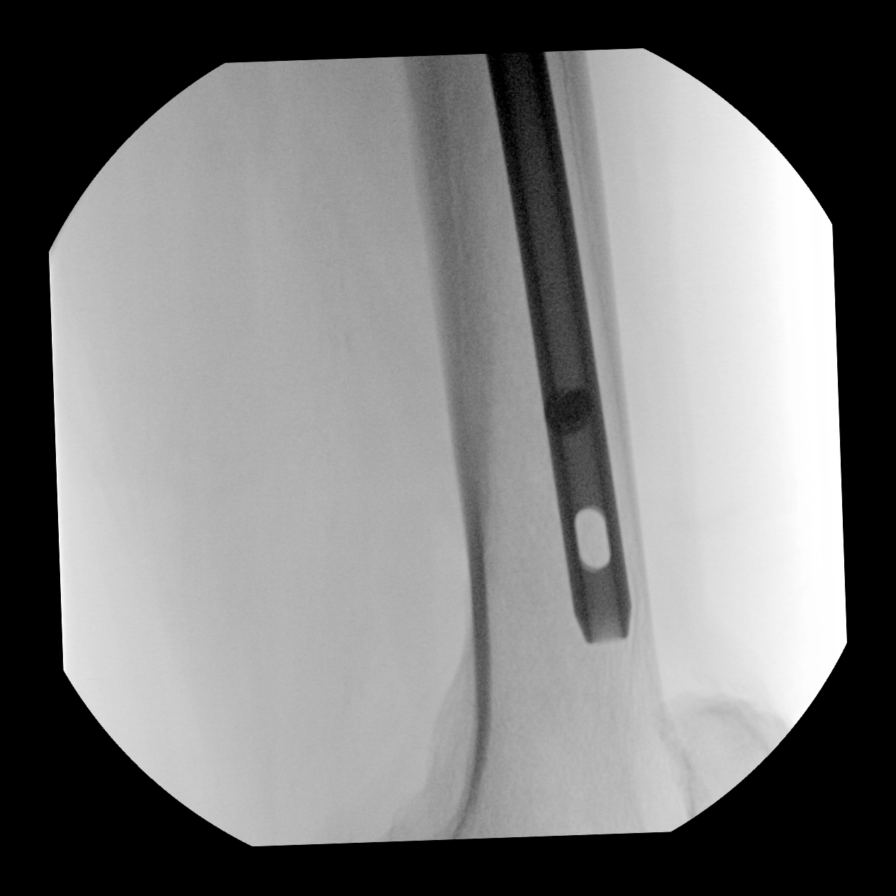

[4 of 4 positions shown; findings below may reference images not displayed]

FINDINGS: Four submitted images show placement of 2 screws supported by a long
intramedullary rod reducing the primary fracture components of the
intertrochanteric proximal femur fracture into near anatomic
alignment. The orthopedic hardware is well-seated.
IMPRESSION: Well-positioned proximal left femur intertrochanteric fracture
following ORIF.

## 2022-07-27 ENCOUNTER — Inpatient Hospital Stay: Admission: RE | Admit: 2022-07-27 | Payer: Medicare HMO | Source: Ambulatory Visit

## 2022-11-05 ENCOUNTER — Emergency Department (HOSPITAL_COMMUNITY)
Admission: EM | Admit: 2022-11-05 | Discharge: 2022-11-05 | Disposition: A | Discharge status: Home / Self Care | Payer: Medicare HMO | Attending: Emergency Medicine | Admitting: Emergency Medicine

## 2022-11-05 ENCOUNTER — Other Ambulatory Visit: Payer: Self-pay

## 2022-11-05 ENCOUNTER — Encounter (HOSPITAL_COMMUNITY): Payer: Self-pay | Admitting: Emergency Medicine

## 2022-11-05 ENCOUNTER — Emergency Department (HOSPITAL_COMMUNITY): Payer: Medicare HMO

## 2022-11-05 DIAGNOSIS — R42 Dizziness and giddiness: Secondary | ICD-10-CM | POA: Diagnosis not present

## 2022-11-05 DIAGNOSIS — R200 Anesthesia of skin: Secondary | ICD-10-CM | POA: Diagnosis not present

## 2022-11-05 DIAGNOSIS — R519 Headache, unspecified: Secondary | ICD-10-CM | POA: Diagnosis not present

## 2022-11-05 DIAGNOSIS — Y9301 Activity, walking, marching and hiking: Secondary | ICD-10-CM | POA: Diagnosis not present

## 2022-11-05 DIAGNOSIS — R0789 Other chest pain: Secondary | ICD-10-CM | POA: Diagnosis not present

## 2022-11-05 DIAGNOSIS — W01198A Fall on same level from slipping, tripping and stumbling with subsequent striking against other object, initial encounter: Secondary | ICD-10-CM | POA: Insufficient documentation

## 2022-11-05 DIAGNOSIS — S6991XA Unspecified injury of right wrist, hand and finger(s), initial encounter: Secondary | ICD-10-CM | POA: Diagnosis present

## 2022-11-05 DIAGNOSIS — S52501A Unspecified fracture of the lower end of right radius, initial encounter for closed fracture: Secondary | ICD-10-CM | POA: Insufficient documentation

## 2022-11-05 DIAGNOSIS — Y92531 Health care provider office as the place of occurrence of the external cause: Secondary | ICD-10-CM | POA: Insufficient documentation

## 2022-11-05 DIAGNOSIS — R202 Paresthesia of skin: Secondary | ICD-10-CM | POA: Diagnosis not present

## 2022-11-05 DIAGNOSIS — W19XXXA Unspecified fall, initial encounter: Secondary | ICD-10-CM

## 2022-11-05 MED ORDER — OXYCODONE-ACETAMINOPHEN 5-325 MG PO TABS
1.0000 | ORAL_TABLET | Freq: Once | ORAL | Status: AC
  Administered 2022-11-05: 1 via ORAL
  Filled 2022-11-05: qty 1

## 2022-11-05 MED ORDER — HYDROCODONE-ACETAMINOPHEN 5-325 MG PO TABS: 1.0000 | ORAL_TABLET | Freq: Three times a day (TID) | ORAL | 0 refills | Status: DC | PRN

## 2022-11-05 NOTE — Discharge Instructions (Addendum)
You were seen in the ER for injuries related to a trip and fall.  The imaging of your head, neck, and face showed no broken bones or abnormalities.  Your knee and rib x-rays also show no broken bones.  You do have a fracture in your right wrist and have been placed in a splint.  I have attached information about splint care.   I am referring you to Cindee Salt with [facility], please call their office as soon as possible to schedule an appointment.   I am sending you home with medication to manage severe pain.  If you experience constipation, you can use stool softeners to help.  I recommend using Tylenol for mild-moderate pain.   Return to the ER if you develop new or worsening symptoms.

## 2022-11-05 NOTE — ED Provider Notes (Signed)
[facility] DEPT Provider Note   CSN: [id number] Arrival date & time: 11/05/22  1623     History  Chief Complaint  Patient presents with   Fall   Wrist Pain    April Tanner is a 81 y.o. female presents to the ER complaining of pain to her right side following a mechanical fall.  Patient states she tripped while walking into the urology office earlier today fell on outstretched hands and the right side of her face.  Patient is complaining of right cheek pain, right wrist pain, right rib pain, and right knee pain.  She was able to ambulate following the fall.  Denies weakness, syncope, numbness, tingling, lightheadedness, dizziness, headache.  She reports that she is on blood thinners.     Home Medications Prior to Admission medications   Medication Sig Start Date End Date Taking? Authorizing Provider  HYDROcodone-acetaminophen (NORCO/VICODIN) 5-325 MG tablet Take 1 tablet by mouth every 8 (eight) hours as needed for severe pain. 11/05/22  Yes Davaris Youtsey R, PA  acetaminophen (TYLENOL) 650 MG CR tablet Take 650 mg by mouth every 8 (eight) hours as needed for pain.    [provider]  amLODipine (NORVASC) 5 MG tablet Take 5 mg by mouth 2 (two) times daily. 08/15/20   [provider]  atenolol (TENORMIN) 25 MG tablet Take 25 mg by mouth at bedtime.  08/15/20   [provider]  enoxaparin (LOVENOX) 40 MG/0.4ML injection Inject 0.4 mLs (40 mg total) into the skin daily for 14 days. 10/12/20 10/26/20  Leandrew Koyanagi, MD  Multiple Vitamin (MULTIVITAMIN) tablet Take 1 tablet by mouth daily.    [provider]  polyethylene glycol (MIRALAX / GLYCOLAX) 17 g packet Take 17 g by mouth daily as needed for mild constipation. 10/15/20   Barb Merino, MD  sertraline (ZOLOFT) 50 MG tablet Take 50 mg by mouth at bedtime.  08/15/20   [provider]      Allergies    Patient has no known allergies.    Review of Systems    Review of Systems  Musculoskeletal:  Negative for back pain and neck pain.       Pain to right ribs, right wrist, and right knee  Neurological:  Negative for dizziness, syncope, weakness, light-headedness and headaches.    Physical Exam Updated Vital Signs BP (!) 170/86 (BP Location: Left Arm)   Pulse 62   Temp 98.4 F (36.9 C) (Oral)   Resp 18   SpO2 94%  Physical Exam Vitals and nursing note reviewed.  Constitutional:      General: She is not in acute distress.    Appearance: Normal appearance. She is not ill-appearing or diaphoretic.  HENT:     Head: Normocephalic. Contusion present. No raccoon eyes, abrasion, right periorbital erythema or left periorbital erythema.     Jaw: There is normal jaw occlusion.      Comments: Tenderness over right zygomatic bone with contusion.  Eyes:     Extraocular Movements: Extraocular movements intact.     Conjunctiva/sclera: Conjunctivae normal.  Cardiovascular:     Rate and Rhythm: Normal rate and regular rhythm.     Pulses: Normal pulses.     Heart sounds: Normal heart sounds.  Pulmonary:     Effort: Pulmonary effort is normal. No respiratory distress.     Breath sounds: Normal breath sounds and air entry.  Chest:     Chest wall: Tenderness present.  Comments: Tenderness to right ribs without crepitus or deformity Musculoskeletal:     Right wrist: Swelling, tenderness, bony tenderness and snuff box tenderness present. No deformity, lacerations or crepitus. Decreased range of motion.     Comments: Ecchymosis and swelling over snuffbox region with tenderness.  ROM limited by pain.  Sensation intact.  Neurological:     Mental Status: She is alert. Mental status is at baseline.  Psychiatric:        Mood and Affect: Mood normal.        Behavior: Behavior normal.     ED Results / Procedures / Treatments   Labs (all labs ordered are listed, but only abnormal results are displayed) Labs Reviewed - No data to  display  EKG None  Radiology DG Ribs Unilateral W/Chest Right  Result Date: 11/05/2022 CLINICAL DATA:  Pain after fall, injury. EXAM: RIGHT RIBS AND CHEST - 3+ VIEW COMPARISON:  None Available. FINDINGS: No fracture or other bone lesions are seen involving the ribs. There is no evidence of pneumothorax or pleural effusion. Subsegmental atelectasis at the right lung base. Heart size and mediastinal contours are within normal limits. IMPRESSION: No right rib fracture. Subsegmental atelectasis at the right lung base. Electronically Signed   By: Keith Rake M.D.   On: 11/05/2022 18:24   DG Knee Complete 4 Views Right  Result Date: 11/05/2022 CLINICAL DATA:  Pain after fall, injury. EXAM: RIGHT KNEE - COMPLETE 4+ VIEW COMPARISON:  None Available. FINDINGS: No acute fracture or dislocation. There is mild tricompartmental osteoarthritis with peripheral spurring. Medial and lateral tibiofemoral chondrocalcinosis. There is a small knee joint effusion. Vascular calcifications are seen. IMPRESSION: 1. No fracture or dislocation of the right knee. 2. Mild tricompartmental osteoarthritis and chondrocalcinosis. Small joint effusion. Electronically Signed   By: Keith Rake M.D.   On: 11/05/2022 18:23   DG Wrist Complete Right  Result Date: 11/05/2022 CLINICAL DATA:  Fall with injury. EXAM: RIGHT WRIST - COMPLETE 3+ VIEW COMPARISON:  None Available. FINDINGS: Minimally displaced radial styloid fracture. Fracture extends to the radiocarpal joint space. The carpal bones are intact. Osteoarthritis of the thumb carpal metacarpal joint. Slight ulna minus variance. Small faintly calcified density about the dorsal aspect of the proximal carpal row is likely a soft tissue calcification rather than a triquetral fracture. IMPRESSION: Minimally displaced radial styloid fracture extending to the radiocarpal joint space. Electronically Signed   By: Keith Rake M.D.   On: 11/05/2022 18:20   CT Head Wo  Contrast  Result Date: 11/05/2022 CLINICAL DATA:  Head trauma, minor (Age >= 65y) Fall at Urology. EXAM: CT HEAD WITHOUT CONTRAST TECHNIQUE: Contiguous axial images were obtained from the base of the skull through the vertex without intravenous contrast. RADIATION DOSE REDUCTION: This exam was performed according to the departmental dose-optimization program which includes automated exposure control, adjustment of the mA and/or kV according to patient size and/or use of iterative reconstruction technique. COMPARISON:  None Available. FINDINGS: Brain: No intracranial hemorrhage, mass effect, or midline shift. Normal brain volume for age. No hydrocephalus. The basilar cisterns are patent. Periventricular and deep chronic small vessel ischemic change. No evidence of territorial infarct or acute ischemia. No extra-axial or intracranial fluid collection. Vascular: Atherosclerosis of skullbase vasculature without hyperdense vessel or abnormal calcification. Skull: No fracture or focal lesion. Hyperostosis frontals. Sinuses/Orbits: Assessed on concurrent face CT, reported separately. Other: None. IMPRESSION: 1. No acute intracranial abnormality. No skull fracture. 2. Mild chronic small vessel ischemic change. Electronically Signed   By:  Narda Rutherford M.D.   On: 11/05/2022 18:19   CT Maxillofacial WO CM  Result Date: 11/05/2022 CLINICAL DATA:  Blunt facial trauma. Fall at Urology. EXAM: CT MAXILLOFACIAL WITHOUT CONTRAST TECHNIQUE: Multidetector CT imaging of the maxillofacial structures was performed. Multiplanar CT image reconstructions were also generated. RADIATION DOSE REDUCTION: This exam was performed according to the departmental dose-optimization program which includes automated exposure control, adjustment of the mA and/or kV according to patient size and/or use of iterative reconstruction technique. COMPARISON:  None Available. FINDINGS: Osseous: No acute fracture of the nasal bone, zygomatic arches or  mandibles. Degenerative change of the right greater than left temporomandibular joint. Patient is edentulous. Intact maxilla and pterygoid plates. Orbits: No orbital fracture. No globe injury. Sinuses: No sinus fracture or hemosinus. The paranasal sinuses are clear. Soft tissues: Right-sided facial soft tissue edema. Limited intracranial: Assessed on concurrent head CT, reported separately. IMPRESSION: 1. No acute facial bone fracture. 2. Right-sided facial soft tissue edema. Electronically Signed   By: Narda Rutherford M.D.   On: 11/05/2022 18:16   CT Cervical Spine Wo Contrast  Result Date: 11/05/2022 CLINICAL DATA:  Fall with injury. Fall while at Urology. EXAM: CT CERVICAL SPINE WITHOUT CONTRAST TECHNIQUE: Multidetector CT imaging of the cervical spine was performed without intravenous contrast. Multiplanar CT image reconstructions were also generated. RADIATION DOSE REDUCTION: This exam was performed according to the departmental dose-optimization program which includes automated exposure control, adjustment of the mA and/or kV according to patient size and/or use of iterative reconstruction technique. COMPARISON:  None Available. FINDINGS: Alignment: Slight broad-based scoliotic curvature. Otherwise normal alignment, no traumatic subluxation. Skull base and vertebrae: No acute fracture. Vertebral body heights are maintained. The dens and skull base are intact. Incidental bone island within T2. Soft tissues and spinal canal: No prevertebral fluid or swelling. No visible canal hematoma. Disc levels: Mild diffuse degenerative disc disease most prominently affecting C5-C6 and C6-C7. Multilevel facet hypertrophy, more so on the left. Upper chest: No acute or unexpected findings. Other: None. IMPRESSION: Mild degenerative change in the cervical spine without acute fracture or subluxation. Electronically Signed   By: Narda Rutherford M.D.   On: 11/05/2022 18:14    Procedures Procedures    Medications  Ordered in ED Medications  oxyCODONE-acetaminophen (PERCOCET/ROXICET) 5-325 MG per tablet 1 tablet (has no administration in time range)    ED Course/ Medical Decision Making/ A&P                           Medical Decision Making Amount and/or Complexity of Data Reviewed Radiology: ordered.   This patient presents to the ED with chief complaint(s) of pain to right wrist, right ribs, right cheek, and right knee with pertinent past medical history of previous fracture and anticoagulation .The complaint involves an extensive differential diagnosis and also carries with it a high risk of complications and morbidity.    The differential diagnosis includes intracranial injury, bony injuries to skull, face, right wrist, ribs and/or knee    The initial plan is to obtain CT imaging of head, c-spine, and maxillofacial bones; obtain plain films of right ribs, wrist, and knee  Initial Assessment:   On exam, patient is sitting comfortably in triage.  She is alert and oriented.  She has obvious contusion to right cheek with tenderness to palpation of zygomatic arch.  No cervical tenderness, ROM intact.  No other head or facial injuries appreciated on exam.  She has ecchymosis to  right wrist with tenderness over anatomical snuffbox.  She is neurovascularly intact.  Tenderness to palpation of right ribs that is worse with deep breathing.  Lung sounds clear to auscultation bilaterally.  Right knee is tender to palpation without deformity.  She is able to ambulate.  Independent visualization and interpretation of imaging: I independently visualized the following imaging with scope of interpretation limited to determining acute life threatening conditions related to emergency care: CT head, maxillofacial, cervical spine, which revealed no skull fracture, intracranial abnormality, subluxation of cervical spine or acute fracture, or facial bone fracture.  Right wrist x-ray demonstrates minimally displaced radial  styloid fracture extending to the radiocarpal joint space. Right knee x-ray demonstrates osteoarthritic changes, with mild joint effusion, but no acute fracture or dislocation. No acute rib fractures seen on x-ray.  Treatment and Reassessment: Treated patient's pain and placed right arm in a sugar-tong splint.  Patient tolerated splint placement well and pain has improved.    Disposition:   Patient had no acute injuries to head, neck, maxillofacial structures, ribs or knee.  Her distal radial fracture was stabilized with a sugar-tong splint, was minimally displaced and did not require reduction.  She is neurovascularly intact.  Discussed with patient findings of x-ray imaging and need for orthopedic follow-up.  Will send patient home on medication for severe pain, recommended Tylenol for mild-moderate pain.  Return precautions given.         Final Clinical Impression(s) / ED Diagnoses Final diagnoses:  Closed fracture of distal end of right radius, unspecified fracture morphology, initial encounter  Fall, initial encounter    Rx / DC Orders ED Discharge Orders          Ordered    HYDROcodone-acetaminophen (NORCO/VICODIN) 5-325 MG tablet  Every 8 hours PRN        11/05/22 1901              Pat Kocher, [place] 11/05/22 Magdalen Spatz, MD 11/07/22 1100

## 2022-11-05 NOTE — ED Triage Notes (Signed)
Pt reports falling down at [facility] urology. Pt reports hitting toe and falling on right knee, wrist and hitting face.

## 2022-11-12 ENCOUNTER — Ambulatory Visit
Admission: RE | Admit: 2022-11-12 | Discharge: 2022-11-12 | Disposition: A | Discharge status: Home / Self Care | Payer: Medicare HMO | Source: Ambulatory Visit | Attending: Registered Nurse | Admitting: Registered Nurse

## 2022-11-12 ENCOUNTER — Other Ambulatory Visit: Payer: Self-pay | Admitting: Registered Nurse

## 2022-11-12 DIAGNOSIS — R0781 Pleurodynia: Secondary | ICD-10-CM

## 2023-01-04 ENCOUNTER — Other Ambulatory Visit: Payer: Self-pay | Admitting: Registered Nurse

## 2023-01-04 DIAGNOSIS — E2839 Other primary ovarian failure: Secondary | ICD-10-CM

## 2023-02-22 ENCOUNTER — Other Ambulatory Visit: Payer: Medicare HMO

## 2024-01-13 ENCOUNTER — Other Ambulatory Visit: Payer: Self-pay | Admitting: Family Medicine

## 2024-01-13 DIAGNOSIS — Z0000 Encounter for general adult medical examination without abnormal findings: Secondary | ICD-10-CM

## 2024-01-13 DIAGNOSIS — M25561 Pain in right knee: Secondary | ICD-10-CM

## 2024-01-14 ENCOUNTER — Other Ambulatory Visit: Payer: Self-pay | Admitting: Registered Nurse

## 2024-01-14 DIAGNOSIS — Z0000 Encounter for general adult medical examination without abnormal findings: Secondary | ICD-10-CM

## 2024-02-01 ENCOUNTER — Other Ambulatory Visit: Payer: Self-pay | Admitting: Registered Nurse

## 2024-02-01 ENCOUNTER — Ambulatory Visit
Admission: RE | Admit: 2024-02-01 | Discharge: 2024-02-01 | Disposition: A | Discharge status: Home / Self Care | Payer: Medicare HMO | Source: Ambulatory Visit | Attending: Registered Nurse | Admitting: Registered Nurse

## 2024-02-01 DIAGNOSIS — Z0000 Encounter for general adult medical examination without abnormal findings: Secondary | ICD-10-CM

## 2024-02-02 ENCOUNTER — Other Ambulatory Visit: Payer: Medicare HMO

## 2024-02-07 ENCOUNTER — Ambulatory Visit
Admission: RE | Admit: 2024-02-07 | Discharge: 2024-02-07 | Disposition: A | Discharge status: Home / Self Care | Payer: Medicare HMO | Source: Ambulatory Visit | Attending: Registered Nurse

## 2024-03-16 ENCOUNTER — Other Ambulatory Visit: Payer: Self-pay | Admitting: Registered Nurse

## 2024-03-16 DIAGNOSIS — M8580 Other specified disorders of bone density and structure, unspecified site: Secondary | ICD-10-CM

## 2024-05-17 ENCOUNTER — Emergency Department (HOSPITAL_COMMUNITY)

## 2024-05-17 ENCOUNTER — Other Ambulatory Visit: Payer: Self-pay

## 2024-05-17 ENCOUNTER — Emergency Department (HOSPITAL_COMMUNITY)
Admission: EM | Admit: 2024-05-17 | Discharge: 2024-05-17 | Disposition: A | Discharge status: Home / Self Care | Attending: Emergency Medicine | Admitting: Emergency Medicine

## 2024-05-17 DIAGNOSIS — M5441 Lumbago with sciatica, right side: Secondary | ICD-10-CM | POA: Diagnosis not present

## 2024-05-17 DIAGNOSIS — W19XXXA Unspecified fall, initial encounter: Secondary | ICD-10-CM

## 2024-05-17 DIAGNOSIS — G8929 Other chronic pain: Secondary | ICD-10-CM | POA: Diagnosis not present

## 2024-05-17 DIAGNOSIS — S0003XA Contusion of scalp, initial encounter: Secondary | ICD-10-CM | POA: Insufficient documentation

## 2024-05-17 DIAGNOSIS — W010XXA Fall on same level from slipping, tripping and stumbling without subsequent striking against object, initial encounter: Secondary | ICD-10-CM | POA: Insufficient documentation

## 2024-05-17 DIAGNOSIS — S5002XA Contusion of left elbow, initial encounter: Secondary | ICD-10-CM | POA: Diagnosis not present

## 2024-05-17 DIAGNOSIS — S0990XA Unspecified injury of head, initial encounter: Secondary | ICD-10-CM | POA: Diagnosis present

## 2024-05-17 LAB — CBC WITH DIFFERENTIAL/PLATELET
Abs Immature Granulocytes: 0.01 10*3/uL (ref 0.00–0.07)
Basophils Absolute: 0 10*3/uL (ref 0.0–0.1)
Basophils Relative: 1 %
Eosinophils Absolute: 0.1 10*3/uL (ref 0.0–0.5)
Eosinophils Relative: 1 %
HCT: 40.1 % (ref 36.0–46.0)
Hemoglobin: 13.1 g/dL (ref 12.0–15.0)
Immature Granulocytes: 0 %
Lymphocytes Relative: 21 %
Lymphs Abs: 1.4 10*3/uL (ref 0.7–4.0)
MCH: 31.5 pg (ref 26.0–34.0)
MCHC: 32.7 g/dL (ref 30.0–36.0)
MCV: 96.4 fL (ref 80.0–100.0)
Monocytes Absolute: 0.5 10*3/uL (ref 0.1–1.0)
Monocytes Relative: 7 %
Neutro Abs: 4.6 10*3/uL (ref 1.7–7.7)
Neutrophils Relative %: 70 %
Platelets: 217 10*3/uL (ref 150–400)
RBC: 4.16 MIL/uL (ref 3.87–5.11)
RDW: 11.8 % (ref 11.5–15.5)
WBC: 6.5 10*3/uL (ref 4.0–10.5)
nRBC: 0 % (ref 0.0–0.2)

## 2024-05-17 LAB — BASIC METABOLIC PANEL WITH GFR
Anion gap: 11 (ref 5–15)
BUN: 15 mg/dL (ref 8–23)
CO2: 22 mmol/L (ref 22–32)
Calcium: 9.6 mg/dL (ref 8.9–10.3)
Chloride: 108 mmol/L (ref 98–111)
Creatinine, Ser: 0.84 mg/dL (ref 0.44–1.00)
GFR, Estimated: >60 mL/min (ref >60)
Glucose, Bld: 106 mg/dL — ABNORMAL HIGH (ref 70–99)
Potassium: 4 mmol/L (ref 3.5–5.1)
Sodium: 141 mmol/L (ref 135–145)

## 2024-05-17 MED ORDER — OXYCODONE HCL 5 MG PO TABS
5.0000 mg | ORAL_TABLET | Freq: Once | ORAL | Status: AC
  Administered 2024-05-17: 5 mg via ORAL
  Filled 2024-05-17: qty 1

## 2024-05-17 MED ORDER — KETOROLAC TROMETHAMINE 15 MG/ML IJ SOLN
15.0000 mg | Freq: Once | INTRAMUSCULAR | Status: AC
  Administered 2024-05-17: 15 mg via INTRAMUSCULAR
  Filled 2024-05-17: qty 1

## 2024-05-17 MED ORDER — ACETAMINOPHEN 500 MG PO TABS
1000.0000 mg | ORAL_TABLET | Freq: Once | ORAL | Status: AC
  Administered 2024-05-17: 1000 mg via ORAL
  Filled 2024-05-17: qty 2

## 2024-05-17 NOTE — Discharge Instructions (Signed)

## 2024-05-17 NOTE — ED Triage Notes (Signed)
 Mechanical fall. Pain to the back of her head where she landed (no bleeding). No thinners. Denies LOC and remembers event. Only complaint is "where my goose egg is."

## 2024-05-17 NOTE — ED Notes (Signed)
 Pt reports recently prescribed BP meds but hasn't started taking them yet.

## 2024-05-17 NOTE — ED Provider Notes (Signed)
 April Tanner EMERGENCY DEPARTMENT AT [facility] Provider Note   CSN: [id number] Arrival date & time: 05/17/24  1516     History  Chief Complaint  Patient presents with   April Tanner    April Tanner is a 83 y.o. female.  83 yo F with a chief complaint of a fall.  Patient feels like maybe her shoe got stuck on the ground and that made her fall.  She is complaining mostly of a head injury thinks that she bumped her left elbow and has perhaps worsening of her chronic right-sided low back pain that radiates down the leg.  She denies chest pain denies difficulty breathing.  Does not think that she passed out.   Fall       Home Medications Prior to Admission medications   Medication Sig Start Date End Date Taking? Authorizing Provider  acetaminophen  (TYLENOL ) 650 MG CR tablet Take 650 mg by mouth every 8 (eight) hours as needed for pain.    [provider]  amLODipine  (NORVASC ) 5 MG tablet Take 5 mg by mouth 2 (two) times daily. 08/15/20   [provider]  atenolol  (TENORMIN ) 25 MG tablet Take 25 mg by mouth at bedtime.  08/15/20   [provider]  enoxaparin  (LOVENOX ) 40 MG/0.4ML injection Inject 0.4 mLs (40 mg total) into the skin daily for 14 days. 10/12/20 10/26/20  Wes Hamman, MD  HYDROcodone -acetaminophen  (NORCO/VICODIN) 5-325 MG tablet Take 1 tablet by mouth every 8 (eight) hours as needed for severe pain. 11/05/22   Clark, Meghan R, PA-C  Multiple Vitamin (MULTIVITAMIN) tablet Take 1 tablet by mouth daily.    [provider]  polyethylene glycol (MIRALAX  / GLYCOLAX ) 17 g packet Take 17 g by mouth daily as needed for mild constipation. 10/15/20   Vada Garibaldi, MD  sertraline  (ZOLOFT ) 50 MG tablet Take 50 mg by mouth at bedtime.  08/15/20   [provider]      Allergies    Patient has no known allergies.    Review of Systems   Review of Systems  Physical Exam Updated Vital Signs BP (!) 192/91   Pulse 70   Temp 98.7  F (37.1 C)   Resp (!) 23   Ht 5\' 5"  (1.651 m)   Wt 80.3 kg   SpO2 98%   BMI 29.45 kg/m  Physical Exam Vitals and nursing note reviewed.  Constitutional:      General: She is not in acute distress.    Appearance: She is well-developed. She is not diaphoretic.  HENT:     Head: Normocephalic.     Comments: Small occipital hematoma Eyes:     Pupils: Pupils are equal, round, and reactive to light.  Cardiovascular:     Rate and Rhythm: Normal rate and regular rhythm.     Heart sounds: No murmur heard.    No friction rub. No gallop.  Pulmonary:     Effort: Pulmonary effort is normal.     Breath sounds: No wheezing or rales.  Abdominal:     General: There is no distension.     Palpations: Abdomen is soft.     Tenderness: There is no abdominal tenderness.  Musculoskeletal:        General: No tenderness.     Cervical back: Normal range of motion and neck supple.     Comments: No obvious midline spinal tenderness step-offs or deformities.  She has some mild bruising to the left olecranon process however there is  no pain with range of motion of the elbow and is able to supinate and pronate without issue.  Palpated from head to toe without any other obvious injury there is point tenderness  Skin:    General: Skin is warm and dry.  Neurological:     Mental Status: She is alert and oriented to person, place, and time.  Psychiatric:        Behavior: Behavior normal.     ED Results / Procedures / Treatments   Labs (all labs ordered are listed, but only abnormal results are displayed) Labs Reviewed  BASIC METABOLIC PANEL WITH GFR - Abnormal; Notable for the following components:      Result Value   Glucose, Bld 106 (*)    All other components within normal limits  CBC WITH DIFFERENTIAL/PLATELET    EKG EKG Interpretation Date/Time:  Wednesday May 17 2024 16:14:54 EDT Ventricular Rate:  71 PR Interval:  165 QRS Duration:  80 QT Interval:  408 QTC Calculation: 444 R  Axis:   68  Text Interpretation: Sinus rhythm No significant change since last tracing Confirmed by Albertus Hughs [phone]) on 05/17/2024 6:42:26 PM  Radiology CT Head Wo Contrast Addendum Date: 05/17/2024 ADDENDUM REPORT: 05/17/2024 18:29 ADDENDUM: Trace left parieto-occipital scalp hematoma. Electronically Signed   By: Morgane  Naveau M.D.   On: 05/17/2024 18:29   Result Date: 05/17/2024 CLINICAL DATA:  Head trauma, minor (Age >= 65y) EXAM: CT HEAD WITHOUT CONTRAST TECHNIQUE: Contiguous axial images were obtained from the base of the skull through the vertex without intravenous contrast. RADIATION DOSE REDUCTION: This exam was performed according to the departmental dose-optimization program which includes automated exposure control, adjustment of the mA and/or kV according to patient size and/or use of iterative reconstruction technique. COMPARISON:  CT head 11/05/2022 FINDINGS: Brain: Stable patchy and confluent areas of decreased attenuation are noted throughout the deep and periventricular white matter of the cerebral hemispheres bilaterally, compatible with chronic microvascular ischemic disease. No evidence of large-territorial acute infarction. No parenchymal hemorrhage. No mass lesion. No extra-axial collection. No mass effect or midline shift. No hydrocephalus. Basilar cisterns are patent. Vascular: No hyperdense vessel. Atherosclerotic calcifications are present within the cavernous internal carotid and vertebral arteries. Skull: No acute fracture or focal lesion. Sinuses/Orbits: Paranasal sinuses and mastoid air cells are clear. Bilateral lens replacement. Otherwise the orbits are unremarkable. Other: None. IMPRESSION: No acute intracranial abnormality. Electronically Signed: By: Morgane  Naveau M.D. On: 05/17/2024 18:22   CT Lumbar Spine Wo Contrast Result Date: 05/17/2024 CLINICAL DATA:  Lumbar radiculopathy, trauma EXAM: CT LUMBAR SPINE WITHOUT CONTRAST TECHNIQUE: Multidetector CT imaging of the  lumbar spine was performed without intravenous contrast administration. Multiplanar CT image reconstructions were also generated. RADIATION DOSE REDUCTION: This exam was performed according to the departmental dose-optimization program which includes automated exposure control, adjustment of the mA and/or kV according to patient size and/or use of iterative reconstruction technique. COMPARISON:  None Available. FINDINGS: Segmentation: 5 lumbar type vertebrae. Alignment: Grade 1 anterolisthesis above L5 on S1. Vertebrae: No acute fracture or focal pathologic process. Multilevel disc bulge. No severe osseous neural foraminal or central canal stenosis. Paraspinal and other soft tissues: Negative. Disc levels: Multilevel intervertebral disc space vacuum phenomenon. Other: Severe atherosclerotic plaque. IMPRESSION: No acute displaced fracture or traumatic listhesis of the lumbar spine. Electronically Signed   By: Morgane  Naveau M.D.   On: 05/17/2024 18:28    Procedures Procedures    Medications Ordered in ED Medications  acetaminophen  (TYLENOL ) tablet 1,000 mg (  1,000 mg Oral Given 05/17/24 1628)  ketorolac (TORADOL) 15 MG/ML injection 15 mg (15 mg Intramuscular Given 05/17/24 1631)  oxyCODONE  (Oxy IR/ROXICODONE ) immediate release tablet 5 mg (5 mg Oral Given 05/17/24 1628)    ED Course/ Medical Decision Making/ A&P                                 Medical Decision Making Amount and/or Complexity of Data Reviewed Labs: ordered. Radiology: ordered. ECG/medicine tests: ordered.  Risk OTC drugs. Prescription drug management.   83 yo F with a chief complaints of a fall.  Nonsyncopal by history.  Patient thinks that she may have lost her balance and her family thinks that she is due to her worsening chronic right-sided low back pain that radiates down the leg.  She has had this worked up mostly as an outpatient.  Incontinence at baseline.  Has seen a urologist.  Other than age she has no other obvious  red flags.  She does feel like the pain is worse status post fall.  Will obtain CT imaging.  Reassess.   Patient feeling much better on repeat assessment.  CT of the head without obvious intracranial hemorrhage on my independent interpretation.  CT of the L-spine without acute fracture.  Blood work without acute anemia, no significant electrolyte abnormalities.  Will discharge home.  PCP follow-up.  7:24 PM:  I have discussed the diagnosis/risks/treatment options with the patient and family.  Evaluation and diagnostic testing in the emergency department does not suggest an emergent condition requiring admission or immediate intervention beyond what has been performed at this time.  They will follow up with PCP. We also discussed returning to the ED immediately if new or worsening sx occur. We discussed the sx which are most concerning (e.g., sudden worsening pain, fever, inability to tolerate by mouth, cauda equina s/sx) that necessitate immediate return. Medications administered to the patient during their visit and any new prescriptions provided to the patient are listed below.  Medications given during this visit Medications  acetaminophen  (TYLENOL ) tablet 1,000 mg (1,000 mg Oral Given 05/17/24 1628)  ketorolac (TORADOL) 15 MG/ML injection 15 mg (15 mg Intramuscular Given 05/17/24 1631)  oxyCODONE  (Oxy IR/ROXICODONE ) immediate release tablet 5 mg (5 mg Oral Given 05/17/24 1628)     The patient appears reasonably screen and/or stabilized for discharge and I doubt any other medical condition or other [facility] requiring further screening, evaluation, or treatment in the ED at this time prior to discharge.         Final Clinical Impression(s) / ED Diagnoses Final diagnoses:  Fall, initial encounter  Chronic right-sided low back pain with right-sided sciatica  Scalp hematoma, initial encounter    Rx / DC Orders ED Discharge Orders     None         Albertus Hughs, DO 05/17/24 1924

## 2024-06-18 ENCOUNTER — Other Ambulatory Visit: Payer: Self-pay

## 2024-06-18 ENCOUNTER — Emergency Department (HOSPITAL_COMMUNITY)

## 2024-06-18 ENCOUNTER — Inpatient Hospital Stay (HOSPITAL_COMMUNITY)

## 2024-06-18 ENCOUNTER — Inpatient Hospital Stay (HOSPITAL_COMMUNITY)
Admission: EM | Admit: 2024-06-18 | Discharge: 2024-06-22 | DRG: 480 | Disposition: A | Discharge status: Skilled Nursing Facility | Attending: Internal Medicine | Admitting: Internal Medicine

## 2024-06-18 ENCOUNTER — Encounter (HOSPITAL_COMMUNITY): Payer: Self-pay

## 2024-06-18 DIAGNOSIS — I10 Essential (primary) hypertension: Secondary | ICD-10-CM | POA: Diagnosis present

## 2024-06-18 DIAGNOSIS — G9341 Metabolic encephalopathy: Secondary | ICD-10-CM | POA: Diagnosis present

## 2024-06-18 DIAGNOSIS — D72829 Elevated white blood cell count, unspecified: Secondary | ICD-10-CM | POA: Diagnosis present

## 2024-06-18 DIAGNOSIS — F419 Anxiety disorder, unspecified: Secondary | ICD-10-CM | POA: Diagnosis present

## 2024-06-18 DIAGNOSIS — W19XXXA Unspecified fall, initial encounter: Principal | ICD-10-CM

## 2024-06-18 DIAGNOSIS — S72141A Displaced intertrochanteric fracture of right femur, initial encounter for closed fracture: Secondary | ICD-10-CM | POA: Diagnosis present

## 2024-06-18 DIAGNOSIS — S72001A Fracture of unspecified part of neck of right femur, initial encounter for closed fracture: Secondary | ICD-10-CM | POA: Diagnosis present

## 2024-06-18 DIAGNOSIS — Z79899 Other long term (current) drug therapy: Secondary | ICD-10-CM

## 2024-06-18 DIAGNOSIS — W010XXA Fall on same level from slipping, tripping and stumbling without subsequent striking against object, initial encounter: Secondary | ICD-10-CM | POA: Diagnosis present

## 2024-06-18 DIAGNOSIS — Z7982 Long term (current) use of aspirin: Secondary | ICD-10-CM | POA: Diagnosis not present

## 2024-06-18 DIAGNOSIS — F05 Delirium due to known physiological condition: Secondary | ICD-10-CM | POA: Diagnosis not present

## 2024-06-18 DIAGNOSIS — Y92410 Unspecified street and highway as the place of occurrence of the external cause: Secondary | ICD-10-CM

## 2024-06-18 DIAGNOSIS — E538 Deficiency of other specified B group vitamins: Secondary | ICD-10-CM | POA: Diagnosis present

## 2024-06-18 DIAGNOSIS — F0394 Unspecified dementia, unspecified severity, with anxiety: Secondary | ICD-10-CM | POA: Diagnosis present

## 2024-06-18 LAB — AMMONIA: Ammonia: 14 umol/L (ref 9–35)

## 2024-06-18 LAB — CBC WITH DIFFERENTIAL/PLATELET
Abs Immature Granulocytes: 0.05 10*3/uL (ref 0.00–0.07)
Basophils Absolute: 0.1 10*3/uL (ref 0.0–0.1)
Basophils Relative: 1 %
Eosinophils Absolute: 0 10*3/uL (ref 0.0–0.5)
Eosinophils Relative: 0 %
HCT: 37.9 % (ref 36.0–46.0)
Hemoglobin: 12.6 g/dL (ref 12.0–15.0)
Immature Granulocytes: 0 %
Lymphocytes Relative: 8 %
Lymphs Abs: 1 10*3/uL (ref 0.7–4.0)
MCH: 32 pg (ref 26.0–34.0)
MCHC: 33.2 g/dL (ref 30.0–36.0)
MCV: 96.2 fL (ref 80.0–100.0)
Monocytes Absolute: 0.5 10*3/uL (ref 0.1–1.0)
Monocytes Relative: 4 %
Neutro Abs: 10.7 10*3/uL — ABNORMAL HIGH (ref 1.7–7.7)
Neutrophils Relative %: 87 %
Platelets: 211 10*3/uL (ref 150–400)
RBC: 3.94 MIL/uL (ref 3.87–5.11)
RDW: 11.7 % (ref 11.5–15.5)
WBC: 12.4 10*3/uL — ABNORMAL HIGH (ref 4.0–10.5)
nRBC: 0 % (ref 0.0–0.2)

## 2024-06-18 LAB — URINALYSIS, ROUTINE W REFLEX MICROSCOPIC
Bilirubin Urine: NEGATIVE
Glucose, UA: 150 mg/dL — AB
Ketones, ur: 5 mg/dL — AB
Leukocytes,Ua: NEGATIVE
Nitrite: NEGATIVE
Protein, ur: NEGATIVE mg/dL
Specific Gravity, Urine: 1.024 (ref 1.005–1.030)
pH: 5 (ref 5.0–8.0)

## 2024-06-18 LAB — BASIC METABOLIC PANEL WITH GFR
Anion gap: 9 (ref 5–15)
BUN: 14 mg/dL (ref 8–23)
CO2: 22 mmol/L (ref 22–32)
Calcium: 9 mg/dL (ref 8.9–10.3)
Chloride: 102 mmol/L (ref 98–111)
Creatinine, Ser: 0.66 mg/dL (ref 0.44–1.00)
GFR, Estimated: >60 mL/min (ref >60)
Glucose, Bld: 189 mg/dL — ABNORMAL HIGH (ref 70–99)
Potassium: 3.5 mmol/L (ref 3.5–5.1)
Sodium: 133 mmol/L — ABNORMAL LOW (ref 135–145)

## 2024-06-18 LAB — HEPATIC FUNCTION PANEL
ALT: 12 U/L (ref 0–44)
AST: 16 U/L (ref 15–41)
Albumin: 3.5 g/dL (ref 3.5–5.0)
Alkaline Phosphatase: 97 U/L (ref 38–126)
Bilirubin, Direct: <0.1 mg/dL (ref 0.0–0.2)
Total Bilirubin: 0.7 mg/dL (ref 0.0–1.2)
Total Protein: 6.8 g/dL (ref 6.5–8.1)

## 2024-06-18 LAB — VITAMIN B12: Vitamin B-12: 240 pg/mL (ref 180–914)

## 2024-06-18 LAB — RAPID URINE DRUG SCREEN, HOSP PERFORMED
Amphetamines: NOT DETECTED
Barbiturates: NOT DETECTED
Benzodiazepines: NOT DETECTED
Cocaine: NOT DETECTED
Opiates: POSITIVE — AB
Tetrahydrocannabinol: NOT DETECTED

## 2024-06-18 LAB — ETHANOL: Alcohol, Ethyl (B): <15 mg/dL (ref <15)

## 2024-06-18 LAB — TYPE AND SCREEN
ABO/RH(D): B NEG
Antibody Screen: NEGATIVE

## 2024-06-18 LAB — PROTIME-INR
INR: 1 (ref 0.8–1.2)
Prothrombin Time: 13.5 s (ref 11.4–15.2)

## 2024-06-18 LAB — VITAMIN D 25 HYDROXY (VIT D DEFICIENCY, FRACTURES): Vit D, 25-Hydroxy: 42.91 ng/mL (ref 30–100)

## 2024-06-18 LAB — TSH: TSH: 1.069 u[IU]/mL (ref 0.350–4.500)

## 2024-06-18 MED ORDER — SODIUM CHLORIDE 0.9 % IV SOLN: INTRAVENOUS | Status: AC

## 2024-06-18 MED ORDER — MORPHINE SULFATE (PF) 4 MG/ML IV SOLN
4.0000 mg | Freq: Once | INTRAVENOUS | Status: AC
  Administered 2024-06-18: 4 mg via INTRAVENOUS
  Filled 2024-06-18: qty 1

## 2024-06-18 MED ORDER — METOPROLOL TARTRATE 5 MG/5ML IV SOLN: 5.0000 mg | Freq: Three times a day (TID) | INTRAVENOUS | Status: DC | PRN

## 2024-06-18 MED ORDER — SERTRALINE HCL 50 MG PO TABS
75.0000 mg | ORAL_TABLET | Freq: Every day | ORAL | Status: DC
  Administered 2024-06-18 – 2024-06-21 (×4): 75 mg via ORAL
  Filled 2024-06-18 (×4): qty 1

## 2024-06-18 MED ORDER — DOCUSATE SODIUM 100 MG PO CAPS
100.0000 mg | ORAL_CAPSULE | Freq: Two times a day (BID) | ORAL | Status: DC
  Administered 2024-06-18 – 2024-06-22 (×8): 100 mg via ORAL
  Filled 2024-06-18 (×8): qty 1

## 2024-06-18 MED ORDER — SODIUM CHLORIDE 0.9 % IV SOLN: Freq: Once | INTRAVENOUS | Status: AC

## 2024-06-18 MED ORDER — FUROSEMIDE 40 MG PO TABS: 20.0000 mg | ORAL_TABLET | Freq: Every morning | ORAL | Status: DC

## 2024-06-18 MED ORDER — AMLODIPINE BESYLATE 10 MG PO TABS
10.0000 mg | ORAL_TABLET | Freq: Every day | ORAL | Status: DC
  Administered 2024-06-18 – 2024-06-22 (×5): 10 mg via ORAL
  Filled 2024-06-18 (×5): qty 1

## 2024-06-18 MED ORDER — LOSARTAN POTASSIUM 50 MG PO TABS
50.0000 mg | ORAL_TABLET | Freq: Every day | ORAL | Status: DC
  Administered 2024-06-18 – 2024-06-22 (×5): 50 mg via ORAL
  Filled 2024-06-18 (×5): qty 1

## 2024-06-18 MED ORDER — LOSARTAN POTASSIUM 50 MG PO TABS: 100.0000 mg | ORAL_TABLET | Freq: Every day | ORAL | Status: DC

## 2024-06-18 MED ORDER — ONDANSETRON HCL 4 MG/2ML IJ SOLN
4.0000 mg | Freq: Once | INTRAMUSCULAR | Status: AC
  Administered 2024-06-18: 4 mg via INTRAVENOUS
  Filled 2024-06-18: qty 2

## 2024-06-18 MED ORDER — HYDROCODONE-ACETAMINOPHEN 5-325 MG PO TABS
1.0000 | ORAL_TABLET | Freq: Four times a day (QID) | ORAL | Status: DC | PRN
  Administered 2024-06-18 – 2024-06-19 (×2): 1 via ORAL
  Filled 2024-06-18 (×2): qty 1

## 2024-06-18 MED ORDER — METHOCARBAMOL 500 MG PO TABS
500.0000 mg | ORAL_TABLET | Freq: Four times a day (QID) | ORAL | Status: DC | PRN
Start: 2024-06-18 — End: 2024-06-19
  Administered 2024-06-18: 500 mg via ORAL
  Filled 2024-06-18: qty 1

## 2024-06-18 MED ORDER — HYDROMORPHONE HCL 1 MG/ML IJ SOLN
1.0000 mg | Freq: Once | INTRAMUSCULAR | Status: AC
  Administered 2024-06-18: 1 mg via INTRAVENOUS
  Filled 2024-06-18: qty 1

## 2024-06-18 MED ORDER — MORPHINE SULFATE (PF) 2 MG/ML IV SOLN: 0.5000 mg | INTRAVENOUS | Status: DC | PRN

## 2024-06-18 MED ORDER — METHOCARBAMOL 1000 MG/10ML IJ SOLN: 500.0000 mg | Freq: Four times a day (QID) | INTRAMUSCULAR | Status: DC | PRN

## 2024-06-18 NOTE — Assessment & Plan Note (Signed)
 Likely reactive, no signs/sx of infection Gentle IVF F/u on UA Trend

## 2024-06-18 NOTE — H&P (Signed)
 History and Physical    Patient: April Tanner:[id number] DOB: March 26, 1941 DOA: 06/18/2024 DOS: the patient was seen and examined on 06/18/2024 PCP: Leontine Cramp, NP  Patient coming from: Home - lives with her daughter. Ambulates with cane.    Chief Complaint: fall with hip pain   HPI: April Tanner is a 83 y.o. female with medical history significant of HTN who presented to the ED with complaints of hip pain after a fall. She can't tell me what happened. Her daughter wasn't there either, but from what she can gather there was a dead kitten in the road and she went to pick it up and lost her balance and fell onto her right hip. She was unable to get up. Her niece was there and called EMS.   She denies any recent fever/illness, chest pain or shortness of breath, abdominal pain, N/V/D, dysuria or leg swelling.   She does not smoke, she drinks alcohol. She can't tell me an amount. Her daughter states she drinks once/week, if that.   She is intermittently confused. Her daughter states she gets confused if her blood pressure gets high and with her age and pain medication. EDP reports she was not confused with her and nursing recently gave her IV pain meds.   ER Course:  vitals: afebrile, bp: 196/95, HR: 95, RR: 16, oxygen: 99%RA Pertinent labs: WBC: 12.4,  CT head: no acute finding. Atrophy, chronic microvascular disease CT cervical spine: DDD. No acute finding. 2cm left thyroid nodule  Right hip xray: right femoral neck fracture with varus angulation  In ED: ortho consulted. Given pain meds and zofran . TRH asked to admit.    Review of Systems: As mentioned in the history of present illness. All other systems reviewed and are negative. Past Medical History:  Diagnosis Date   Hypertension    Past Surgical History:  Procedure Laterality Date   ABDOMINAL HYSTERECTOMY     FEMUR IM NAIL Left 10/12/2020   Procedure: INTRAMEDULLARY (IM) NAIL FEMORAL;  Surgeon: Jerri Kay HERO, MD;   Location: [facility] ORS;  Service: Orthopedics;  Laterality: Left;   THYROIDECTOMY     Social History:  reports that she has never smoked. She has never used smokeless tobacco. She reports current alcohol use. She reports that she does not use drugs.  No Known Allergies  History reviewed. No pertinent family history.  Prior to Admission medications   Medication Sig Start Date End Date Taking? Authorizing Provider  acetaminophen  (TYLENOL ) 650 MG CR tablet Take 650 mg by mouth every 8 (eight) hours as needed for pain.   Yes [provider]  amLODipine  (NORVASC ) 10 MG tablet Take 10 mg by mouth daily.   Yes [provider]  aspirin EC 81 MG tablet Take 40.5 mg by mouth daily. Swallow whole.   Yes [provider]  atenolol  (TENORMIN ) 25 MG tablet Take 25 mg by mouth at bedtime.  08/15/20  Yes [provider]  furosemide (LASIX) 20 MG tablet Take 20 mg by mouth in the morning.   Yes [provider]  losartan (COZAAR) 100 MG tablet Take 100 mg by mouth daily.   Yes [provider]  Multiple Vitamin (MULTIVITAMIN) tablet Take 1 tablet by mouth daily.   Yes [provider]  polyethylene glycol (MIRALAX  / GLYCOLAX ) 17 g packet Take 17 g by mouth daily as needed for mild constipation. 10/15/20  Yes Raenelle Coria, MD  sertraline  (ZOLOFT ) 50 MG tablet Take 75 mg by mouth at  bedtime. 08/15/20  Yes [provider]  trimethoprim (TRIMPEX) 100 MG tablet Take 100 mg by mouth daily.   Yes [provider]  enoxaparin  (LOVENOX ) 40 MG/0.4ML injection Inject 0.4 mLs (40 mg total) into the skin daily for 14 days. Patient not taking: Reported on 06/18/2024 10/12/20 06/18/24  Jerri Kay HERO, MD  HYDROcodone -acetaminophen  (NORCO/VICODIN) 5-325 MG tablet Take 1 tablet by mouth every 8 (eight) hours as needed for severe pain. Patient not taking: Reported on 06/18/2024 11/05/22   Gretta Gerard SAUNDERS, PA-C    Physical Exam: Vitals:   06/18/24 1800  06/18/24 1830 06/18/24 1914 06/18/24 1922  BP: (!) 166/95 (!) 180/92  (!) 175/94  Pulse: 89 87  83  Resp: 14 15  17   Temp:    98.3 F (36.8 C)  TempSrc:    Oral  SpO2: 98% 98%  98%  Weight:   78.3 kg   Height:   5' 7 (1.702 m)    General:  Appears calm and comfortable and is in NAD. Pleasantly confuse.d  Eyes:  PERRL, EOMI, normal lids, iris ENT:  grossly normal hearing, lips & tongue, mmm; edentulous Neck:  no LAD, masses or thyromegaly; no carotid bruits Cardiovascular:  RRR, no m/r/g. No LE edema.  Respiratory:   CTA bilaterally with no wheezes/rales/rhonchi.  Normal respiratory effort. Abdomen:  soft, NT, ND, NABS Back:   normal alignment, no CVAT Skin:  no rash or induration seen on limited exam Musculoskeletal: right leg: shortened and abducted. Pedal pulses intact. Sensation intact. No edema in ankle/knee joints. No bruising over hip or rest of leg.  Lower extremity:  No LE edema.  Limited foot exam with no ulcerations.  2+ distal pulses. Psychiatric:  grossly normal mood and affect, speech fluent, but tangential. Pleasantly confused, AO to self only  Neurologic:  CN 2-12 grossly intact, moves all extremities in coordinated fashion, sensation intact   Radiological Exams on Admission: Independently reviewed - see discussion in A/P where applicable  DG CHEST PORT 1 VIEW Result Date: 06/18/2024 CLINICAL DATA:  Confusion EXAM: PORTABLE CHEST 1 VIEW COMPARISON:  Chest x-ray 11/12/2022 FINDINGS: The heart size and mediastinal contours are within normal limits. Both lungs are clear. The visualized skeletal structures are unremarkable. IMPRESSION: No active disease. Electronically Signed   By: Greig Pique M.D.   On: 06/18/2024 18:52   CT Cervical Spine Wo Contrast Result Date: 06/18/2024 CLINICAL DATA:  Polytrauma, blunt.  Fall. EXAM: CT CERVICAL SPINE WITHOUT CONTRAST TECHNIQUE: Multidetector CT imaging of the cervical spine was performed without intravenous contrast. Multiplanar  CT image reconstructions were also generated. RADIATION DOSE REDUCTION: This exam was performed according to the departmental dose-optimization program which includes automated exposure control, adjustment of the mA and/or kV according to patient size and/or use of iterative reconstruction technique. COMPARISON:  11/05/2022 FINDINGS: Alignment: Normal Skull base and vertebrae: No acute fracture. No primary bone lesion or focal pathologic process. Soft tissues and spinal canal: No prevertebral fluid or swelling. No visible canal hematoma. 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 Moderate to advanced bilateral degenerative facet disease, left greater than right. Multilevel bilateral neural foraminal narrowing. No disc herniation. Upper chest: No acute findings Other: 2 cm left thyroid nodule. IMPRESSION: Degenerative disc and facet disease. No acute bony abnormality. 2 cm left thyroid nodule partially visualized. Recommend further evaluation with non emergent thyroid US  (ref: J Am Coll Radiol. 2015 Feb;12(2): 143-50). Electronically Signed   By: Franky  Dover M.D.   On: 06/18/2024 15:36   DG Hip Unilat W or Wo Pelvis 2-3 Views Right Result Date: 06/18/2024 CLINICAL DATA:  Fall, right hip pain EXAM: DG HIP (WITH OR WITHOUT PELVIS) 2-3V RIGHT COMPARISON:  None Available. FINDINGS: There is a right femoral neck fracture with varus angulation. No subluxation or dislocation. Remote hardware noted in the left hip. IMPRESSION: Right femoral neck fracture with varus angulation. Electronically Signed   By: Franky Crease M.D.   On: 06/18/2024 15:34   CT Head Wo Contrast Result Date: 06/18/2024 CLINICAL DATA:  Polytrauma, blunt.  Fall. EXAM: CT HEAD WITHOUT CONTRAST TECHNIQUE: Contiguous axial images were obtained from the base of the skull through the vertex without intravenous contrast. RADIATION DOSE REDUCTION: This exam was performed according to the  departmental dose-optimization program which includes automated exposure control, adjustment of the mA and/or kV according to patient size and/or use of iterative reconstruction technique. COMPARISON:  05/17/2024 FINDINGS: Brain: There is atrophy and chronic small vessel disease changes. No acute intracranial abnormality. Specifically, no hemorrhage, hydrocephalus, mass lesion, acute infarction, or significant intracranial injury. Vascular: No hyperdense vessel or unexpected calcification. Skull: No acute calvarial abnormality. Sinuses/Orbits: No acute findings Other: None IMPRESSION: Atrophy, chronic microvascular disease. No acute intracranial abnormality. Electronically Signed   By: Franky Crease M.D.   On: 06/18/2024 15:33    EKG: Independently reviewed.  NSR with rate 80; nonspecific ST changes with no evidence of acute ischemia   Labs on Admission: I have personally reviewed the available labs and imaging studies at the time of the admission.  Pertinent labs:   WBC: 12.4   Assessment and Plan: Principal Problem:   Closed displaced fracture of right femoral neck (HCC) Active Problems:   Leukocytosis   Acute metabolic encephalopathy   Accelerated hypertension   Anxiety    Assessment and Plan: * Closed displaced fracture of right femoral neck (HCC) 83 year old presenting to ED after mechanical fall and right hip pain found to have a right femoral neck fracture with varus angulation  -admit to med-surg -ortho consulted with plan for surgery tomorrow: (Dr. Paralee)  -NPO at midnight except for meds -SCDs -pain medication with tylenol , norco and morphine  for severe pain (low dose, confused with morphine )  -hip fracture protocol   -type and screen, UA, INR and vitamin D   Leukocytosis Likely reactive, no signs/sx of infection Gentle IVF F/u on UA Trend   Acute metabolic encephalopathy -can not answer any of my questions, unsure if due to pain medication (daughter reports these  tend to make her more confused)  -tangential speech/conversation  -CT head with no acute findings -check metabolic w/u: hepatic panel, TSH, ammonia, B12, UDS  -f/u on UA, check CXR no other infectious signs -daughter states she has had recent cognitive decline with memory loss  -fall/delirium precautions   Accelerated hypertension Currently on norvasc  10mg  daily and losartan 100mg  daily and lasix 20mg  daily  Has a bottle of atenolol  25mg , but this has not been filled since December of 2024 (hold this)  Her daughter doesn't think she is taking any of her medication  Continue norvasc  and start back losartan at 50mg .  IV metoprolol  with parameters Pain control   Anxiety Continue 75mg  of zoloft  daily    Advance Care Planning:   Code Status: Full Code  Consults: ortho: Dr. Paralee   DVT Prophylaxis: SCDs  Family Communication: updated her daughter by phone: Josette Culotier  Severity of Illness: The appropriate patient status  for this patient is INPATIENT. Inpatient status is judged to be reasonable and necessary in order to provide the required intensity of service to ensure the patient's safety. The patient's presenting symptoms, physical exam findings, and initial radiographic and laboratory data in the context of their chronic comorbidities is felt to place them at high risk for further clinical deterioration. Furthermore, it is not anticipated that the patient will be medically stable for discharge from the hospital within 2 midnights of admission.   * I certify that at the point of admission it is my clinical judgment that the patient will require inpatient hospital care spanning beyond 2 midnights from the point of admission due to high intensity of service, high risk for further deterioration and high frequency of surveillance required.*  Author: Isaiah Geralds, MD 06/18/2024 9:50 PM  For on call review [email or web address].

## 2024-06-18 NOTE — Assessment & Plan Note (Signed)
 Continue 75mg  of zoloft  daily

## 2024-06-18 NOTE — ED Provider Notes (Signed)
 [place] EMERGENCY DEPARTMENT AT [facility] Provider Note   CSN: [id number] Arrival date & time: 06/18/24  1314     Patient presents with: Fall and Hip Pain   April Tanner is a 83 y.o. female.   HPI   83 year old female presents emergency department after mechanical fall.  Patient was leaning over trying to pick up a kitten when she lost her balance and fell onto her right side.  She did hit her head.  There was no loss of consciousness.  She is mainly complaining of right hip and buttocks pain.  Was given pain medicine by EMS.  Otherwise denies any acute changes in her health.  History of broken femur on the left that required surgery.  Prior to Admission medications   Medication Sig Start Date End Date Taking? Authorizing Provider  acetaminophen  (TYLENOL ) 650 MG CR tablet Take 650 mg by mouth every 8 (eight) hours as needed for pain.    [provider]  amLODipine  (NORVASC ) 5 MG tablet Take 5 mg by mouth 2 (two) times daily. 08/15/20   [provider]  atenolol  (TENORMIN ) 25 MG tablet Take 25 mg by mouth at bedtime.  08/15/20   [provider]  enoxaparin  (LOVENOX ) 40 MG/0.4ML injection Inject 0.4 mLs (40 mg total) into the skin daily for 14 days. 10/12/20 10/26/20  Jerri Kay HERO, MD  HYDROcodone -acetaminophen  (NORCO/VICODIN) 5-325 MG tablet Take 1 tablet by mouth every 8 (eight) hours as needed for severe pain. 11/05/22   Clark, Meghan R, PA-C  Multiple Vitamin (MULTIVITAMIN) tablet Take 1 tablet by mouth daily.    [provider]  polyethylene glycol (MIRALAX  / GLYCOLAX ) 17 g packet Take 17 g by mouth daily as needed for mild constipation. 10/15/20   Raenelle Coria, MD  sertraline  (ZOLOFT ) 50 MG tablet Take 50 mg by mouth at bedtime.  08/15/20   [provider]    Allergies: Patient has no known allergies.    Review of Systems  Constitutional:  Negative for fever.  Respiratory:  Negative for shortness of breath.    Cardiovascular:  Negative for chest pain.  Gastrointestinal:  Negative for abdominal pain, diarrhea and vomiting.  Musculoskeletal:  Negative for back pain and neck pain.       + Right hip pain  Skin:  Negative for rash.  Neurological:  Negative for headaches.    Updated Vital Signs BP (!) 166/109   Pulse 71   Temp 98.2 F (36.8 C) (Oral)   Resp 17   Ht 5' 7 (1.702 m)   Wt 80.3 kg   SpO2 95%   BMI 27.72 kg/m   Physical Exam Vitals and nursing note reviewed.  Constitutional:      Appearance: Normal appearance.  HENT:     Head: Normocephalic.     Comments: Abrasion to left frontal    Mouth/Throat:     Mouth: Mucous membranes are moist.   Eyes:     Extraocular Movements: Extraocular movements intact.     Pupils: Pupils are equal, round, and reactive to light.   Neck:     Comments: No midline spinal tenderness palpation, c-collar was in place but patient self removed, patient educated on the risks of this Cardiovascular:     Rate and Rhythm: Normal rate.  Pulmonary:     Effort: Pulmonary effort is normal. No respiratory distress.  Abdominal:     Palpations: Abdomen is soft.     Tenderness: There is no abdominal tenderness.  Musculoskeletal:     Comments: Right lower extremity is shortened, slightly internally rotated, equal palpable DP pulses and cap refill   Skin:    General: Skin is warm.   Neurological:     Mental Status: She is alert and oriented to person, place, and time. Mental status is at baseline.   Psychiatric:        Mood and Affect: Mood normal.     (all labs ordered are listed, but only abnormal results are displayed) Labs Reviewed  CBC WITH DIFFERENTIAL/PLATELET  BASIC METABOLIC PANEL WITH GFR    EKG: None  Radiology: No results found.   Procedures   Medications Ordered in the ED  morphine  (PF) 4 MG/ML injection 4 mg (has no administration in time range)                                    Medical Decision Making Amount  and/or Complexity of Data Reviewed Labs: ordered. Radiology: ordered.  Risk Prescription drug management. Decision regarding hospitalization.   83 year old female presents emergency department mechanical fall down onto her right hip.  Also had head injury.  Not on anticoagulation.  Arrived in a c-collar, the patient self removed this, she was educated on the risks associated with this.  Slightly hypertensive on arrival but otherwise vitals are stable.  She is conversational, alert and oriented.  The right lower extremity is shortened and rotated, equal peripheral pulses.  Blood work shows a mild leukocytosis but otherwise is reassuring.  CT of the head and neck showed no acute finding.  X-ray of the hip/pelvis shows a right femoral neck fracture with angulation.  Orthopedic surgeon, Dr. Celena consulted.  He request that I attach his name and Dr. Lyndle name to the treatment team.  Anticipate surgery tomorrow.  Preoperative orders placed, diet with n.p.o. at midnight.  Will continue pain control.  Patients evaluation and results requires admission for further treatment and care.  Spoke with hospitalist, reviewed patient's ED course and they accept admission.  Patient agrees with admission plan, offers no new complaints and is stable/unchanged at time of admit.     Final diagnoses:  None    ED Discharge Orders     None          Bari Roxie HERO, DO 06/18/24 1742

## 2024-06-18 NOTE — ED Triage Notes (Signed)
 PT BIB EMS coming from home. Pt had a fall today trying to pick up a kitten. She fell on her right side. Hit her head. No LOC. Internal rotation of leg. Having lateral neck pain. Pt in c-collar upon arrival. Hx: HTN, has not taken BP meds today.   BP 194/112, Hr 70s, Spo2 95%, CBG 20g Right forearm. 100mcg fentanyl  given en route

## 2024-06-18 NOTE — Assessment & Plan Note (Addendum)
-  can not answer any of my questions, unsure if due to pain medication (daughter reports these tend to make her more confused)  -tangential speech/conversation  -CT head with no acute findings -check metabolic w/u: hepatic panel, TSH, ammonia, B12, UDS  -f/u on UA, check CXR no other infectious signs -daughter states she has had recent cognitive decline with memory loss  -fall/delirium precautions

## 2024-06-18 NOTE — Progress Notes (Signed)
 [facility]   Consult received for R femoral neck fracture  Full consult to follow  Anticipate OR tomorrow, likely with Dr. Ernie for repair  NPO after MN   Full length pre-op femur films ordered   Francis MICAEL Mt, PA-C [phone] (C) 06/18/2024, 8:19 PM  [facility] [address] [place] [address] [phone] April Tanner (F)      Patient ID: April Tanner, female   DOB: May 04, 1941, 83 y.o.   MRN: [id number]

## 2024-06-18 NOTE — Assessment & Plan Note (Addendum)
 83 year old presenting to ED after mechanical fall and right hip pain found to have a right femoral neck fracture with varus angulation  -admit to med-surg -ortho consulted with plan for surgery tomorrow: (Dr. Paralee)  -NPO at midnight except for meds -SCDs -pain medication with tylenol , norco and morphine  for severe pain (low dose, confused with morphine )  -hip fracture protocol   -type and screen, UA, INR and vitamin D

## 2024-06-18 NOTE — ED Notes (Signed)
 Pt placed on nasal cannula 2lpm

## 2024-06-18 NOTE — Assessment & Plan Note (Addendum)
 Currently on norvasc  10mg  daily and losartan 100mg  daily and lasix 20mg  daily  Has a bottle of atenolol  25mg , but this has not been filled since December of 2024 (hold this)  Her daughter doesn't think she is taking any of her medication  Continue norvasc  and start back losartan at 50mg .  IV metoprolol  with parameters Pain control

## 2024-06-19 ENCOUNTER — Inpatient Hospital Stay (HOSPITAL_COMMUNITY)

## 2024-06-19 ENCOUNTER — Encounter (HOSPITAL_COMMUNITY)
Admission: EM | Disposition: A | Discharge status: Skilled Nursing Facility | Payer: Self-pay | Source: Home / Self Care | Attending: Family Medicine

## 2024-06-19 ENCOUNTER — Inpatient Hospital Stay (HOSPITAL_COMMUNITY): Admitting: Certified Registered Nurse Anesthetist

## 2024-06-19 ENCOUNTER — Encounter (HOSPITAL_COMMUNITY): Payer: Self-pay | Admitting: Family Medicine

## 2024-06-19 DIAGNOSIS — G9341 Metabolic encephalopathy: Secondary | ICD-10-CM

## 2024-06-19 DIAGNOSIS — D72829 Elevated white blood cell count, unspecified: Secondary | ICD-10-CM

## 2024-06-19 DIAGNOSIS — S72001A Fracture of unspecified part of neck of right femur, initial encounter for closed fracture: Secondary | ICD-10-CM | POA: Diagnosis not present

## 2024-06-19 DIAGNOSIS — S72141A Displaced intertrochanteric fracture of right femur, initial encounter for closed fracture: Secondary | ICD-10-CM | POA: Diagnosis not present

## 2024-06-19 DIAGNOSIS — I10 Essential (primary) hypertension: Secondary | ICD-10-CM | POA: Diagnosis not present

## 2024-06-19 HISTORY — PX: INTRAMEDULLARY (IM) NAIL INTERTROCHANTERIC: SHX5875

## 2024-06-19 LAB — SURGICAL PCR SCREEN
MRSA, PCR: NEGATIVE
Staphylococcus aureus: NEGATIVE

## 2024-06-19 LAB — CBC
HCT: 37.8 % (ref 36.0–46.0)
Hemoglobin: 11.7 g/dL — ABNORMAL LOW (ref 12.0–15.0)
MCH: 31.5 pg (ref 26.0–34.0)
MCHC: 31 g/dL (ref 30.0–36.0)
MCV: 101.6 fL — ABNORMAL HIGH (ref 80.0–100.0)
Platelets: 226 10*3/uL (ref 150–400)
RBC: 3.72 MIL/uL — ABNORMAL LOW (ref 3.87–5.11)
RDW: 11.8 % (ref 11.5–15.5)
WBC: 9.7 10*3/uL (ref 4.0–10.5)
nRBC: 0 % (ref 0.0–0.2)

## 2024-06-19 LAB — BASIC METABOLIC PANEL WITH GFR
Anion gap: 8 (ref 5–15)
BUN: 16 mg/dL (ref 8–23)
CO2: 25 mmol/L (ref 22–32)
Calcium: 9.3 mg/dL (ref 8.9–10.3)
Chloride: 104 mmol/L (ref 98–111)
Creatinine, Ser: 0.75 mg/dL (ref 0.44–1.00)
GFR, Estimated: >60 mL/min (ref >60)
Glucose, Bld: 141 mg/dL — ABNORMAL HIGH (ref 70–99)
Potassium: 4.3 mmol/L (ref 3.5–5.1)
Sodium: 137 mmol/L (ref 135–145)

## 2024-06-19 SURGERY — FIXATION, FRACTURE, INTERTROCHANTERIC, WITH INTRAMEDULLARY ROD
Anesthesia: General | Site: Hip | Laterality: Right

## 2024-06-19 MED ORDER — SUGAMMADEX SODIUM 200 MG/2ML IV SOLN
INTRAVENOUS | Status: DC | PRN
  Administered 2024-06-19: 200 mg via INTRAVENOUS

## 2024-06-19 MED ORDER — ATENOLOL 50 MG PO TABS
25.0000 mg | ORAL_TABLET | Freq: Every day | ORAL | Status: DC
  Administered 2024-06-20 – 2024-06-22 (×3): 25 mg via ORAL
  Filled 2024-06-19 (×3): qty 1

## 2024-06-19 MED ORDER — POLYETHYLENE GLYCOL 3350 17 G PO PACK
17.0000 g | PACK | Freq: Two times a day (BID) | ORAL | Status: DC
  Administered 2024-06-19 – 2024-06-22 (×6): 17 g via ORAL
  Filled 2024-06-19 (×6): qty 1

## 2024-06-19 MED ORDER — OXYCODONE HCL 5 MG PO TABS
5.0000 mg | ORAL_TABLET | ORAL | Status: DC | PRN
  Administered 2024-06-20: 5 mg via ORAL
  Administered 2024-06-22 (×2): 10 mg via ORAL
  Filled 2024-06-19: qty 2
  Filled 2024-06-19: qty 1
  Filled 2024-06-19: qty 2

## 2024-06-19 MED ORDER — HYDRALAZINE HCL 20 MG/ML IJ SOLN: 10.0000 mg | INTRAMUSCULAR | Status: DC | PRN

## 2024-06-19 MED ORDER — ASPIRIN 81 MG PO CHEW
81.0000 mg | CHEWABLE_TABLET | Freq: Two times a day (BID) | ORAL | Status: DC
  Administered 2024-06-19 – 2024-06-22 (×6): 81 mg via ORAL
  Filled 2024-06-19 (×6): qty 1

## 2024-06-19 MED ORDER — DEXAMETHASONE SODIUM PHOSPHATE 10 MG/ML IJ SOLN
INTRAMUSCULAR | Status: AC
Start: 2024-06-19 — End: 2024-06-19
  Filled 2024-06-19: qty 1

## 2024-06-19 MED ORDER — ACETAMINOPHEN 500 MG PO TABS
1000.0000 mg | ORAL_TABLET | Freq: Four times a day (QID) | ORAL | Status: DC
  Administered 2024-06-19 – 2024-06-22 (×8): 1000 mg via ORAL
  Filled 2024-06-19 (×8): qty 2

## 2024-06-19 MED ORDER — FENTANYL CITRATE PF 50 MCG/ML IJ SOSY
25.0000 ug | PREFILLED_SYRINGE | INTRAMUSCULAR | Status: DC | PRN
  Administered 2024-06-19: 25 ug via INTRAVENOUS

## 2024-06-19 MED ORDER — POVIDONE-IODINE 10 % EX SWAB: 2.0000 | Freq: Once | CUTANEOUS | Status: DC

## 2024-06-19 MED ORDER — FENTANYL CITRATE PF 50 MCG/ML IJ SOSY
PREFILLED_SYRINGE | INTRAMUSCULAR | Status: AC
  Filled 2024-06-19: qty 1

## 2024-06-19 MED ORDER — 0.9 % SODIUM CHLORIDE (POUR BTL) OPTIME
TOPICAL | Status: DC | PRN
  Administered 2024-06-19: 1000 mL

## 2024-06-19 MED ORDER — METOCLOPRAMIDE HCL 5 MG PO TABS: 5.0000 mg | ORAL_TABLET | Freq: Three times a day (TID) | ORAL | Status: DC | PRN

## 2024-06-19 MED ORDER — PROPOFOL 10 MG/ML IV BOLUS
INTRAVENOUS | Status: DC | PRN
  Administered 2024-06-19: 50 mg via INTRAVENOUS
  Administered 2024-06-19: 150 mg via INTRAVENOUS

## 2024-06-19 MED ORDER — FENTANYL CITRATE (PF) 100 MCG/2ML IJ SOLN
INTRAMUSCULAR | Status: AC
Start: 2024-06-19 — End: 2024-06-19
  Filled 2024-06-19: qty 2

## 2024-06-19 MED ORDER — ONDANSETRON HCL 4 MG/2ML IJ SOLN
INTRAMUSCULAR | Status: DC | PRN
Start: 2024-06-19 — End: 2024-06-19
  Administered 2024-06-19: 4 mg via INTRAVENOUS

## 2024-06-19 MED ORDER — CHLORHEXIDINE GLUCONATE 4 % EX SOLN: 60.0000 mL | Freq: Once | CUTANEOUS | Status: DC

## 2024-06-19 MED ORDER — ADULT MULTIVITAMIN W/MINERALS CH
1.0000 | ORAL_TABLET | Freq: Every day | ORAL | Status: DC
  Administered 2024-06-20 – 2024-06-22 (×3): 1 via ORAL
  Filled 2024-06-19 (×3): qty 1

## 2024-06-19 MED ORDER — ROCURONIUM BROMIDE 100 MG/10ML IV SOLN
INTRAVENOUS | Status: DC | PRN
  Administered 2024-06-19: 50 mg via INTRAVENOUS

## 2024-06-19 MED ORDER — CEFAZOLIN SODIUM-DEXTROSE 2-4 GM/100ML-% IV SOLN
2.0000 g | Freq: Four times a day (QID) | INTRAVENOUS | Status: AC
  Administered 2024-06-19 – 2024-06-20 (×2): 2 g via INTRAVENOUS
  Filled 2024-06-19 (×2): qty 100

## 2024-06-19 MED ORDER — FENTANYL CITRATE (PF) 100 MCG/2ML IJ SOLN
INTRAMUSCULAR | Status: DC | PRN
  Administered 2024-06-19: 100 ug via INTRAVENOUS

## 2024-06-19 MED ORDER — ALUM & MAG HYDROXIDE-SIMETH 200-200-20 MG/5ML PO SUSP: 30.0000 mL | ORAL | Status: DC | PRN

## 2024-06-19 MED ORDER — LACTATED RINGERS IV SOLN: INTRAVENOUS | Status: DC

## 2024-06-19 MED ORDER — TRANEXAMIC ACID-NACL 1000-0.7 MG/100ML-% IV SOLN
1000.0000 mg | Freq: Once | INTRAVENOUS | Status: AC
  Administered 2024-06-19: 1000 mg via INTRAVENOUS
  Filled 2024-06-19: qty 100

## 2024-06-19 MED ORDER — CHLORHEXIDINE GLUCONATE 0.12 % MT SOLN
15.0000 mL | Freq: Once | OROMUCOSAL | Status: DC
  Administered 2024-06-19: 15 mL via OROMUCOSAL

## 2024-06-19 MED ORDER — LIDOCAINE HCL (PF) 2 % IJ SOLN
INTRAMUSCULAR | Status: AC
  Filled 2024-06-19: qty 5

## 2024-06-19 MED ORDER — MENTHOL 3 MG MT LOZG: 1.0000 | LOZENGE | OROMUCOSAL | Status: DC | PRN

## 2024-06-19 MED ORDER — SODIUM CHLORIDE 0.9 % IV SOLN: INTRAVENOUS | Status: DC

## 2024-06-19 MED ORDER — ONDANSETRON HCL 4 MG/2ML IJ SOLN
INTRAMUSCULAR | Status: AC
  Filled 2024-06-19: qty 2

## 2024-06-19 MED ORDER — DIPHENHYDRAMINE HCL 12.5 MG/5ML PO ELIX
12.5000 mg | ORAL_SOLUTION | ORAL | Status: DC | PRN
  Administered 2024-06-19: 25 mg via ORAL
  Filled 2024-06-19: qty 10

## 2024-06-19 MED ORDER — TRANEXAMIC ACID-NACL 1000-0.7 MG/100ML-% IV SOLN
1000.0000 mg | INTRAVENOUS | Status: AC
  Administered 2024-06-19: 1000 mg via INTRAVENOUS
  Filled 2024-06-19: qty 100

## 2024-06-19 MED ORDER — ONDANSETRON HCL 4 MG PO TABS: 4.0000 mg | ORAL_TABLET | Freq: Four times a day (QID) | ORAL | Status: DC | PRN

## 2024-06-19 MED ORDER — SENNA 8.6 MG PO TABS
2.0000 | ORAL_TABLET | Freq: Every day | ORAL | Status: DC
  Administered 2024-06-19 – 2024-06-21 (×3): 17.2 mg via ORAL
  Filled 2024-06-19 (×3): qty 2

## 2024-06-19 MED ORDER — PHENOL 1.4 % MT LIQD: 1.0000 | OROMUCOSAL | Status: DC | PRN

## 2024-06-19 MED ORDER — HYDROMORPHONE HCL 1 MG/ML IJ SOLN: 0.5000 mg | INTRAMUSCULAR | Status: DC | PRN

## 2024-06-19 MED ORDER — ENSURE PLUS HIGH PROTEIN PO LIQD
237.0000 mL | Freq: Two times a day (BID) | ORAL | Status: DC
  Administered 2024-06-20 – 2024-06-22 (×5): 237 mL via ORAL

## 2024-06-19 MED ORDER — OXYCODONE HCL 5 MG PO TABS
2.5000 mg | ORAL_TABLET | ORAL | Status: DC | PRN
  Administered 2024-06-20: 5 mg via ORAL
  Filled 2024-06-19: qty 1

## 2024-06-19 MED ORDER — DEXAMETHASONE SODIUM PHOSPHATE 10 MG/ML IJ SOLN
INTRAMUSCULAR | Status: DC | PRN
  Administered 2024-06-19: 8 mg via INTRAVENOUS

## 2024-06-19 MED ORDER — METHOCARBAMOL 500 MG PO TABS
500.0000 mg | ORAL_TABLET | Freq: Four times a day (QID) | ORAL | Status: DC | PRN
  Administered 2024-06-19 – 2024-06-22 (×4): 500 mg via ORAL
  Filled 2024-06-19 (×4): qty 1

## 2024-06-19 MED ORDER — ONDANSETRON HCL 4 MG/2ML IJ SOLN: 4.0000 mg | Freq: Four times a day (QID) | INTRAMUSCULAR | Status: DC | PRN

## 2024-06-19 MED ORDER — PROPOFOL 10 MG/ML IV BOLUS
INTRAVENOUS | Status: AC
Start: 2024-06-19 — End: 2024-06-19
  Filled 2024-06-19: qty 20

## 2024-06-19 MED ORDER — LIDOCAINE HCL (CARDIAC) PF 100 MG/5ML IV SOSY
PREFILLED_SYRINGE | INTRAVENOUS | Status: DC | PRN
  Administered 2024-06-19: 50 mg via INTRAVENOUS

## 2024-06-19 MED ORDER — BISACODYL 10 MG RE SUPP: 10.0000 mg | Freq: Every day | RECTAL | Status: DC | PRN

## 2024-06-19 MED ORDER — ROCURONIUM BROMIDE 10 MG/ML (PF) SYRINGE
PREFILLED_SYRINGE | INTRAVENOUS | Status: AC
  Filled 2024-06-19: qty 10

## 2024-06-19 MED ORDER — METHOCARBAMOL 1000 MG/10ML IJ SOLN: 500.0000 mg | Freq: Four times a day (QID) | INTRAMUSCULAR | Status: DC | PRN

## 2024-06-19 MED ORDER — ACETAMINOPHEN 325 MG PO TABS: 325.0000 mg | ORAL_TABLET | Freq: Four times a day (QID) | ORAL | Status: DC | PRN

## 2024-06-19 MED ORDER — PHENYLEPHRINE HCL (PRESSORS) 10 MG/ML IV SOLN
INTRAVENOUS | Status: DC | PRN
  Administered 2024-06-19: 100 ug via INTRAVENOUS
  Administered 2024-06-19: 200 ug via INTRAVENOUS

## 2024-06-19 MED ORDER — CEFAZOLIN SODIUM-DEXTROSE 2-4 GM/100ML-% IV SOLN
2.0000 g | INTRAVENOUS | Status: AC
  Administered 2024-06-19: 2 g via INTRAVENOUS
  Filled 2024-06-19: qty 100

## 2024-06-19 MED ORDER — METOCLOPRAMIDE HCL 5 MG/ML IJ SOLN: 5.0000 mg | Freq: Three times a day (TID) | INTRAMUSCULAR | Status: DC | PRN

## 2024-06-19 MED ORDER — DEXAMETHASONE SODIUM PHOSPHATE 10 MG/ML IJ SOLN
10.0000 mg | Freq: Once | INTRAMUSCULAR | Status: AC
  Administered 2024-06-20: 10 mg via INTRAVENOUS
  Filled 2024-06-19: qty 1

## 2024-06-19 SURGICAL SUPPLY — 31 items
BAG ZIPLOCK 12X15 (MISCELLANEOUS) ×1 IMPLANT
BIT DRILL CANN LG 4.3MM (BIT) IMPLANT
COVER PERINEAL POST (MISCELLANEOUS) ×1 IMPLANT
COVER SURGICAL LIGHT HANDLE (MISCELLANEOUS) ×1 IMPLANT
DERMABOND ADVANCED .7 DNX12 (GAUZE/BANDAGES/DRESSINGS) ×1 IMPLANT
DRAPE STERI IOBAN 125X83 (DRAPES) ×1 IMPLANT
DRSG AQUACEL AG ADV 3.5X 6 (GAUZE/BANDAGES/DRESSINGS) IMPLANT
DURAPREP 26ML APPLICATOR (WOUND CARE) ×1 IMPLANT
ELECT REM PT RETURN 15FT ADLT (MISCELLANEOUS) ×1 IMPLANT
FACESHIELD WRAPAROUND (MASK) ×2 IMPLANT
FACESHIELD WRAPAROUND OR TEAM (MASK) ×1 IMPLANT
GLOVE BIO SURGEON STRL SZ 6 (GLOVE) ×1 IMPLANT
GLOVE BIOGEL PI IND STRL 6.5 (GLOVE) ×1 IMPLANT
GLOVE BIOGEL PI IND STRL 7.5 (GLOVE) ×1 IMPLANT
GLOVE ORTHO TXT STRL SZ7.5 (GLOVE) ×2 IMPLANT
GOWN STRL REUS W/ TWL LRG LVL3 (GOWN DISPOSABLE) ×2 IMPLANT
GUIDEPIN VERSANAIL DSP 3.2X444 (ORTHOPEDIC DISPOSABLE SUPPLIES) IMPLANT
KIT BASIN OR (CUSTOM PROCEDURE TRAY) ×1 IMPLANT
KIT TURNOVER KIT A (KITS) ×1 IMPLANT
MANIFOLD NEPTUNE II (INSTRUMENTS) ×1 IMPLANT
NAIL HIP FRACT 130D 11X180 (Screw) IMPLANT
PACK GENERAL/GYN (CUSTOM PROCEDURE TRAY) ×1 IMPLANT
SCREW BONE CORTICAL 5.0X36 (Screw) IMPLANT
SCREW LAG 10.5MMX105MM HFN (Screw) IMPLANT
SCREW LAG HIP NAIL 10.5X95 (Screw) IMPLANT
SUT MNCRL AB 4-0 PS2 18 (SUTURE) ×1 IMPLANT
SUT VIC AB 1 CT1 36 (SUTURE) ×3 IMPLANT
SUT VIC AB 2-0 CT1 TAPERPNT 27 (SUTURE) ×2 IMPLANT
TOWEL GREEN STERILE FF (TOWEL DISPOSABLE) ×1 IMPLANT
TUBE SUCTION HIGH CAP CLEAR NV (SUCTIONS) ×1 IMPLANT
WATER STERILE IRR 1000ML POUR (IV SOLUTION) ×1 IMPLANT

## 2024-06-19 NOTE — TOC Initial Note (Addendum)
 Transition of Care [facility]) - Initial/Assessment Note    Patient Details  Name: April Tanner MRN: [id number] Date of Birth: [birth year]  Transition of Care [facility]) CM/SW Contact:    Alfonse JONELLE Rex, RN Phone Number: 06/19/2024, 9:54 AM  Clinical Narrative:   Met with patient at a bedside to introduce role of TOC/NCM and review for dc planning, patient reports she resides with her daughter in a mobile home, states several members live in close proximity of their home, pt reports no current home care services, home DME: RW, [place], pt reports she works at [employer] 4 days a week. Per bedside nurse, surgery scheduled for later today. TOC consult for possible rehab at discharge, request sent to attending for PT eval. TOC will continue to follow.   Expected Discharge Plan: Home w Home Health Services Barriers to Discharge: Continued Medical Work up   Patient Goals and CMS Choice Patient states their goals for this hospitalization and ongoing recovery are:: return home          Expected Discharge Plan and Services       Living arrangements for the past 2 months: Mobile Home                                      Prior Living Arrangements/Services Living arrangements for the past 2 months: Mobile Home Lives with:: Adult Children Patient language and need for interpreter reviewed:: Yes Do you feel safe going back to the place where you live?: Yes      Need for Family Participation in Patient Care: Yes (Comment) Care giver support system in place?: Yes (comment) Current home services: DME (cane , walker) Criminal Activity/Legal Involvement Pertinent to Current Situation/Hospitalization: No - Comment as needed  Activities of Daily Living   ADL Screening (condition at time of admission) Independently performs ADLs?: Yes (appropriate for developmental age) Is the patient deaf or have difficulty hearing?: No Does the patient have difficulty seeing, even when wearing  glasses/contacts?: No Does the patient have difficulty concentrating, remembering, or making decisions?: No  Permission Sought/Granted                  Emotional Assessment Appearance:: Appears stated age Attitude/Demeanor/Rapport: Engaged Affect (typically observed): Accepting Orientation: : Oriented to Self, Oriented to Place, Oriented to Situation Alcohol / Substance Use: Not Applicable Psych Involvement: No (comment)  Admission diagnosis:  Fall, initial encounter [W19.XXXA] Closed fracture of right hip, initial encounter (HCC) [S72.001A] Closed displaced fracture of right femoral neck (HCC) [S72.001A] Patient Active Problem List   Diagnosis Date Noted   Closed displaced fracture of right femoral neck (HCC) 06/18/2024   Leukocytosis 06/18/2024   Acute metabolic encephalopathy 06/18/2024   Closed fracture of left hip (HCC) 11/26/2020   Accelerated hypertension 10/12/2020   Hyperlipidemia 10/12/2020   Anxiety 10/12/2020   Displaced intertrochanteric fracture of left femur, initial encounter for closed fracture (HCC) 10/12/2020   PCP:  Leontine Cramp, NP Pharmacy:   [id number] - [place], [place] - [address] [place] [address] Phone: [phone] Fax: [phone]     Social Drivers of Health (SDOH) Social History: SDOH Screenings   Food Insecurity: No Food Insecurity (06/18/2024)  Housing: Low Risk  (06/18/2024)  Transportation Needs: No Transportation Needs (06/18/2024)  Utilities: Not At Risk (06/18/2024)  Social Connections: Moderately Isolated (06/18/2024)  Tobacco Use: Low Risk  (06/18/2024)   SDOH Interventions:  Readmission Risk Interventions    06/19/2024    9:45 AM  Readmission Risk Prevention Plan  Post Dischage Appt Complete  Medication Screening Complete  Transportation Screening Complete

## 2024-06-19 NOTE — H&P (View-Only) (Signed)
 Reason for Consult: Right proximal femur fracture Referring Physician: Drusilla, MD  April Tanner is an 83 y.o. female.  HPI: April Tanner is a 83 y.o. female with medical history significant of HTN who presented to the ED with complaints of hip pain after a fall. She can't tell me what happened. Her daughter wasn't there either, but from what she can gather there was a dead kitten in the road and she went to pick it up and lost her balance and fell onto her right hip. She was unable to get up. Her niece was there and called EMS.    She denies any recent fever/illness, chest pain or shortness of breath, abdominal pain, N/V/D, dysuria or leg swelling.    She does not smoke, she drinks alcohol. She can't tell me an amount. Her daughter states she drinks once/week, if that.    She is intermittently confused. Her daughter states she gets confused if her blood pressure gets high and with her age and pain medication. EDP reports she was not confused with her and nursing recently gave her IV pain meds.  She has a prior history of a left intertrochanteric femur fracture treated with trochanteric femoral nail by Dr. Ozell Cummins approximately 3 years ago  Past Medical History:  Diagnosis Date   Hypertension     Past Surgical History:  Procedure Laterality Date   ABDOMINAL HYSTERECTOMY     FEMUR IM NAIL Left 10/12/2020   Procedure: INTRAMEDULLARY (IM) NAIL FEMORAL;  Surgeon: Cummins Kay HERO, MD;  Location: [facility];  Service: Orthopedics;  Laterality: Left;   THYROIDECTOMY      History reviewed. No pertinent family history.  Social History:  reports that she has never smoked. She has never used smokeless tobacco. She reports current alcohol use. She reports that she does not use drugs.  Allergies: No Known Allergies  Medications: I have reviewed the patient's current medications. Scheduled:  amLODipine   10 mg Oral Daily   docusate sodium   100 mg Oral BID   losartan  50 mg Oral Daily    sertraline   75 mg Oral QHS    Results for orders placed or performed during the hospital encounter of 06/18/24 (from the past 24 hours)  CBC with Differential     Status: Abnormal   Collection Time: 06/18/24  3:34 PM  Result Value Ref Range   WBC 12.4 (H) 4.0 - 10.5 K/uL   RBC 3.94 3.87 - 5.11 MIL/uL   Hemoglobin 12.6 12.0 - 15.0 g/dL   HCT 62.0 63.9 - 53.9 %   MCV 96.2 80.0 - 100.0 fL   MCH 32.0 26.0 - 34.0 pg   MCHC 33.2 30.0 - 36.0 g/dL   RDW 88.2 88.4 - 84.4 %   Platelets 211 150 - 400 K/uL   nRBC 0.0 0.0 - 0.2 %   Neutrophils Relative % 87 %   Neutro Abs 10.7 (H) 1.7 - 7.7 K/uL   Lymphocytes Relative 8 %   Lymphs Abs 1.0 0.7 - 4.0 K/uL   Monocytes Relative 4 %   Monocytes Absolute 0.5 0.1 - 1.0 K/uL   Eosinophils Relative 0 %   Eosinophils Absolute 0.0 0.0 - 0.5 K/uL   Basophils Relative 1 %   Basophils Absolute 0.1 0.0 - 0.1 K/uL   Immature Granulocytes 0 %   Abs Immature Granulocytes 0.05 0.00 - 0.07 K/uL  Basic metabolic panel     Status: Abnormal   Collection Time: 06/18/24  3:34 PM  Result Value Ref Range   Sodium 133 (L) 135 - 145 mmol/L   Potassium 3.5 3.5 - 5.1 mmol/L   Chloride 102 98 - 111 mmol/L   CO2 22 22 - 32 mmol/L   Glucose, Bld 189 (H) 70 - 99 mg/dL   BUN 14 8 - 23 mg/dL   Creatinine, Ser 9.33 0.44 - 1.00 mg/dL   Calcium 9.0 8.9 - 89.6 mg/dL   GFR, Estimated >39 >39 mL/min   Anion gap 9 5 - 15  Type and screen [facility]     Status: None   Collection Time: 06/18/24  5:15 PM  Result Value Ref Range   ABO/RH(D) B NEG    Antibody Screen NEG    Sample Expiration      06/21/2024,2359 Performed at [facility], [address], [place], [place] [address]   Protime-INR     Status: None   Collection Time: 06/18/24  5:57 PM  Result Value Ref Range   Prothrombin Time 13.5 11.4 - 15.2 seconds   INR 1.0 0.8 - 1.2  VITAMIN D 25 Hydroxy (Vit-D Deficiency, Fractures)     Status: None   Collection Time: 06/18/24   5:57 PM  Result Value Ref Range   Vit D, 25-Hydroxy 42.91 30 - 100 ng/mL  Ethanol     Status: None   Collection Time: 06/18/24  8:28 PM  Result Value Ref Range   Alcohol, Ethyl (B) <15 <15 mg/dL  Hepatic function panel     Status: None   Collection Time: 06/18/24  8:28 PM  Result Value Ref Range   Total Protein 6.8 6.5 - 8.1 g/dL   Albumin  3.5 3.5 - 5.0 g/dL   AST 16 15 - 41 U/L   ALT 12 0 - 44 U/L   Alkaline Phosphatase 97 38 - 126 U/L   Total Bilirubin 0.7 0.0 - 1.2 mg/dL   Bilirubin, Direct <9.8 0.0 - 0.2 mg/dL   Indirect Bilirubin NOT CALCULATED 0.3 - 0.9 mg/dL  Ammonia     Status: None   Collection Time: 06/18/24  8:28 PM  Result Value Ref Range   Ammonia 14 9 - 35 umol/L  TSH     Status: None   Collection Time: 06/18/24  8:28 PM  Result Value Ref Range   TSH 1.069 0.350 - 4.500 uIU/mL  Vitamin B12     Status: None   Collection Time: 06/18/24  8:28 PM  Result Value Ref Range   Vitamin B-12 240 180 - 914 pg/mL  Urinalysis, Routine w reflex microscopic -Urine, Clean Catch     Status: Abnormal   Collection Time: 06/18/24 10:45 PM  Result Value Ref Range   Color, Urine YELLOW YELLOW   APPearance CLEAR CLEAR   Specific Gravity, Urine 1.024 1.005 - 1.030   pH 5.0 5.0 - 8.0   Glucose, UA 150 (A) NEGATIVE mg/dL   Hgb urine dipstick SMALL (A) NEGATIVE   Bilirubin Urine NEGATIVE NEGATIVE   Ketones, ur 5 (A) NEGATIVE mg/dL   Protein, ur NEGATIVE NEGATIVE mg/dL   Nitrite NEGATIVE NEGATIVE   Leukocytes,Ua NEGATIVE NEGATIVE   RBC / HPF 0-5 0 - 5 RBC/hpf   WBC, UA 0-5 0 - 5 WBC/hpf   Bacteria, UA MANY (A) NONE SEEN   Squamous Epithelial / HPF 0-5 0 - 5 /HPF   Mucus PRESENT   Rapid urine drug screen (hospital performed)     Status: Abnormal   Collection Time: 06/18/24 10:46 PM  Result Value Ref Range   Opiates POSITIVE (A) NONE DETECTED   Cocaine NONE DETECTED NONE DETECTED   Benzodiazepines NONE DETECTED NONE DETECTED   Amphetamines NONE DETECTED NONE DETECTED    Tetrahydrocannabinol NONE DETECTED NONE DETECTED   Barbiturates NONE DETECTED NONE DETECTED  CBC     Status: Abnormal   Collection Time: 06/19/24  3:30 AM  Result Value Ref Range   WBC 9.7 4.0 - 10.5 K/uL   RBC 3.72 (L) 3.87 - 5.11 MIL/uL   Hemoglobin 11.7 (L) 12.0 - 15.0 g/dL   HCT 62.1 63.9 - 53.9 %   MCV 101.6 (H) 80.0 - 100.0 fL   MCH 31.5 26.0 - 34.0 pg   MCHC 31.0 30.0 - 36.0 g/dL   RDW 88.1 88.4 - 84.4 %   Platelets 226 150 - 400 K/uL   nRBC 0.0 0.0 - 0.2 %  Basic metabolic panel     Status: Abnormal   Collection Time: 06/19/24  3:30 AM  Result Value Ref Range   Sodium 137 135 - 145 mmol/L   Potassium 4.3 3.5 - 5.1 mmol/L   Chloride 104 98 - 111 mmol/L   CO2 25 22 - 32 mmol/L   Glucose, Bld 141 (H) 70 - 99 mg/dL   BUN 16 8 - 23 mg/dL   Creatinine, Ser 9.24 0.44 - 1.00 mg/dL   Calcium 9.3 8.9 - 89.6 mg/dL   GFR, Estimated >39 >39 mL/min   Anion gap 8 5 - 15     X-ray: CLINICAL DATA:  Fall, right hip pain   EXAM: DG HIP (WITH OR WITHOUT PELVIS) 2-3V RIGHT   COMPARISON:  None Available.   FINDINGS: There is a right femoral neck fracture with varus angulation. No subluxation or dislocation. Remote hardware noted in the left hip.   IMPRESSION: Right femoral neck fracture with varus angulation.     Electronically Signed   By: Franky Crease M.D.  ROS: As per HPI  Blood pressure (!) 157/86, pulse 78, temperature 97.6 F (36.4 C), temperature source Oral, resp. rate 14, height 5' 7 (1.702 m), weight 78.3 kg, SpO2 99%.  Physical Exam: General:  Appears calm and comfortable and is in NAD. Pleasantly confuse.d  Eyes:  PERRL, EOMI, normal lids, iris ENT:  grossly normal hearing, lips & tongue, mmm; edentulous Neck:  no LAD, masses or thyromegaly; no carotid bruits Cardiovascular:  RRR, no m/r/g. No LE edema.  Respiratory:   CTA bilaterally with no wheezes/rales/rhonchi.  Normal respiratory effort. Abdomen:  soft, NT, ND, NABS Back:   normal alignment, no  CVAT Skin:  no rash or induration seen on limited exam Musculoskeletal: right leg: shortened and abducted. Pedal pulses intact. Sensation intact. No edema in ankle/knee joints. No bruising over hip or rest of leg.  Lower extremity:  No LE edema.  Limited foot exam with no ulcerations.  2+ distal pulses. Psychiatric:  grossly normal mood and affect, speech fluent, but tangential. Pleasantly confused, AO to self only  Neurologic:  CN 2-12 grossly intact, moves all extremities in coordinated fashion, sensation intact  Assessment/Plan: 1.  Right proximal femur fracture, femoral neck with involvement of the greater trochanter  Plan: Right hip fracture with a mildly complex pattern indeterminate if it is a true intertrochanteric femur fracture versus femoral neck fracture.  She will need operative repair of this.  At this point I am considering trying to fix this with a trochanteric nail based on the size of the greater trochanteric segment. N.p.o after  midnight on 06/19/2024 for potential procedure on the same day. Orders will be placed for procedure. I will speak to her daughter regarding the plan  [name] 06/19/2024, 6:57 AM

## 2024-06-19 NOTE — Transfer of Care (Signed)
 Immediate Anesthesia Transfer of Care Note  Patient: April Tanner  Procedure(s) Performed: FIXATION, FRACTURE, INTERTROCHANTERIC, WITH INTRAMEDULLARY ROD (Right: Hip)  Patient Location: PACU  Anesthesia Type:General  Level of Consciousness: awake and sedated  Airway & Oxygen Therapy: Patient Spontanous Breathing and Patient connected to nasal cannula oxygen  Post-op Assessment: Report given to RN and Post -op Vital signs reviewed and stable  Post vital signs: Reviewed and stable  Last Vitals:  Vitals Value Taken Time  BP 188/85 06/19/24 17:11  Temp    Pulse 82 06/19/24 17:14  Resp 20 06/19/24 17:14  SpO2 97 % 06/19/24 17:14  Vitals shown include unfiled device data.  Last Pain:  Vitals:   06/19/24 1420  TempSrc:   PainSc: 0-No pain      Patients Stated Pain Goal: 2 (06/18/24 2204)  Complications: No notable events documented.

## 2024-06-19 NOTE — Consult Note (Signed)
 Reason for Consult: Right proximal femur fracture Referring Physician: Drusilla, MD  April Tanner is an 83 y.o. female.  HPI: April Tanner is a 83 y.o. female with medical history significant of HTN who presented to the ED with complaints of hip pain after a fall. She can't tell me what happened. Her daughter wasn't there either, but from what she can gather there was a dead kitten in the road and she went to pick it up and lost her balance and fell onto her right hip. She was unable to get up. Her niece was there and called EMS.    She denies any recent fever/illness, chest pain or shortness of breath, abdominal pain, N/V/D, dysuria or leg swelling.    She does not smoke, she drinks alcohol. She can't tell me an amount. Her daughter states she drinks once/week, if that.    She is intermittently confused. Her daughter states she gets confused if her blood pressure gets high and with her age and pain medication. EDP reports she was not confused with her and nursing recently gave her IV pain meds.  She has a prior history of a left intertrochanteric femur fracture treated with trochanteric femoral nail by Dr. Ozell Cummins approximately 3 years ago  Past Medical History:  Diagnosis Date   Hypertension     Past Surgical History:  Procedure Laterality Date   ABDOMINAL HYSTERECTOMY     FEMUR IM NAIL Left 10/12/2020   Procedure: INTRAMEDULLARY (IM) NAIL FEMORAL;  Surgeon: Cummins Kay HERO, MD;  Location: [facility];  Service: Orthopedics;  Laterality: Left;   THYROIDECTOMY      History reviewed. No pertinent family history.  Social History:  reports that she has never smoked. She has never used smokeless tobacco. She reports current alcohol use. She reports that she does not use drugs.  Allergies: No Known Allergies  Medications: I have reviewed the patient's current medications. Scheduled:  amLODipine   10 mg Oral Daily   docusate sodium   100 mg Oral BID   losartan  50 mg Oral Daily    sertraline   75 mg Oral QHS    Results for orders placed or performed during the hospital encounter of 06/18/24 (from the past 24 hours)  CBC with Differential     Status: Abnormal   Collection Time: 06/18/24  3:34 PM  Result Value Ref Range   WBC 12.4 (H) 4.0 - 10.5 K/uL   RBC 3.94 3.87 - 5.11 MIL/uL   Hemoglobin 12.6 12.0 - 15.0 g/dL   HCT 62.0 63.9 - 53.9 %   MCV 96.2 80.0 - 100.0 fL   MCH 32.0 26.0 - 34.0 pg   MCHC 33.2 30.0 - 36.0 g/dL   RDW 88.2 88.4 - 84.4 %   Platelets 211 150 - 400 K/uL   nRBC 0.0 0.0 - 0.2 %   Neutrophils Relative % 87 %   Neutro Abs 10.7 (H) 1.7 - 7.7 K/uL   Lymphocytes Relative 8 %   Lymphs Abs 1.0 0.7 - 4.0 K/uL   Monocytes Relative 4 %   Monocytes Absolute 0.5 0.1 - 1.0 K/uL   Eosinophils Relative 0 %   Eosinophils Absolute 0.0 0.0 - 0.5 K/uL   Basophils Relative 1 %   Basophils Absolute 0.1 0.0 - 0.1 K/uL   Immature Granulocytes 0 %   Abs Immature Granulocytes 0.05 0.00 - 0.07 K/uL  Basic metabolic panel     Status: Abnormal   Collection Time: 06/18/24  3:34 PM  Result Value Ref Range   Sodium 133 (L) 135 - 145 mmol/L   Potassium 3.5 3.5 - 5.1 mmol/L   Chloride 102 98 - 111 mmol/L   CO2 22 22 - 32 mmol/L   Glucose, Bld 189 (H) 70 - 99 mg/dL   BUN 14 8 - 23 mg/dL   Creatinine, Ser 9.33 0.44 - 1.00 mg/dL   Calcium 9.0 8.9 - 89.6 mg/dL   GFR, Estimated >39 >39 mL/min   Anion gap 9 5 - 15  Type and screen [facility]     Status: None   Collection Time: 06/18/24  5:15 PM  Result Value Ref Range   ABO/RH(D) B NEG    Antibody Screen NEG    Sample Expiration      06/21/2024,2359 Performed at [facility], [address], [place], [place] [address]   Protime-INR     Status: None   Collection Time: 06/18/24  5:57 PM  Result Value Ref Range   Prothrombin Time 13.5 11.4 - 15.2 seconds   INR 1.0 0.8 - 1.2  VITAMIN D 25 Hydroxy (Vit-D Deficiency, Fractures)     Status: None   Collection Time: 06/18/24   5:57 PM  Result Value Ref Range   Vit D, 25-Hydroxy 42.91 30 - 100 ng/mL  Ethanol     Status: None   Collection Time: 06/18/24  8:28 PM  Result Value Ref Range   Alcohol, Ethyl (B) <15 <15 mg/dL  Hepatic function panel     Status: None   Collection Time: 06/18/24  8:28 PM  Result Value Ref Range   Total Protein 6.8 6.5 - 8.1 g/dL   Albumin  3.5 3.5 - 5.0 g/dL   AST 16 15 - 41 U/L   ALT 12 0 - 44 U/L   Alkaline Phosphatase 97 38 - 126 U/L   Total Bilirubin 0.7 0.0 - 1.2 mg/dL   Bilirubin, Direct <9.8 0.0 - 0.2 mg/dL   Indirect Bilirubin NOT CALCULATED 0.3 - 0.9 mg/dL  Ammonia     Status: None   Collection Time: 06/18/24  8:28 PM  Result Value Ref Range   Ammonia 14 9 - 35 umol/L  TSH     Status: None   Collection Time: 06/18/24  8:28 PM  Result Value Ref Range   TSH 1.069 0.350 - 4.500 uIU/mL  Vitamin B12     Status: None   Collection Time: 06/18/24  8:28 PM  Result Value Ref Range   Vitamin B-12 240 180 - 914 pg/mL  Urinalysis, Routine w reflex microscopic -Urine, Clean Catch     Status: Abnormal   Collection Time: 06/18/24 10:45 PM  Result Value Ref Range   Color, Urine YELLOW YELLOW   APPearance CLEAR CLEAR   Specific Gravity, Urine 1.024 1.005 - 1.030   pH 5.0 5.0 - 8.0   Glucose, UA 150 (A) NEGATIVE mg/dL   Hgb urine dipstick SMALL (A) NEGATIVE   Bilirubin Urine NEGATIVE NEGATIVE   Ketones, ur 5 (A) NEGATIVE mg/dL   Protein, ur NEGATIVE NEGATIVE mg/dL   Nitrite NEGATIVE NEGATIVE   Leukocytes,Ua NEGATIVE NEGATIVE   RBC / HPF 0-5 0 - 5 RBC/hpf   WBC, UA 0-5 0 - 5 WBC/hpf   Bacteria, UA MANY (A) NONE SEEN   Squamous Epithelial / HPF 0-5 0 - 5 /HPF   Mucus PRESENT   Rapid urine drug screen (hospital performed)     Status: Abnormal   Collection Time: 06/18/24 10:46 PM  Result Value Ref Range   Opiates POSITIVE (A) NONE DETECTED   Cocaine NONE DETECTED NONE DETECTED   Benzodiazepines NONE DETECTED NONE DETECTED   Amphetamines NONE DETECTED NONE DETECTED    Tetrahydrocannabinol NONE DETECTED NONE DETECTED   Barbiturates NONE DETECTED NONE DETECTED  CBC     Status: Abnormal   Collection Time: 06/19/24  3:30 AM  Result Value Ref Range   WBC 9.7 4.0 - 10.5 K/uL   RBC 3.72 (L) 3.87 - 5.11 MIL/uL   Hemoglobin 11.7 (L) 12.0 - 15.0 g/dL   HCT 62.1 63.9 - 53.9 %   MCV 101.6 (H) 80.0 - 100.0 fL   MCH 31.5 26.0 - 34.0 pg   MCHC 31.0 30.0 - 36.0 g/dL   RDW 88.1 88.4 - 84.4 %   Platelets 226 150 - 400 K/uL   nRBC 0.0 0.0 - 0.2 %  Basic metabolic panel     Status: Abnormal   Collection Time: 06/19/24  3:30 AM  Result Value Ref Range   Sodium 137 135 - 145 mmol/L   Potassium 4.3 3.5 - 5.1 mmol/L   Chloride 104 98 - 111 mmol/L   CO2 25 22 - 32 mmol/L   Glucose, Bld 141 (H) 70 - 99 mg/dL   BUN 16 8 - 23 mg/dL   Creatinine, Ser 9.24 0.44 - 1.00 mg/dL   Calcium 9.3 8.9 - 89.6 mg/dL   GFR, Estimated >39 >39 mL/min   Anion gap 8 5 - 15     X-ray: CLINICAL DATA:  Fall, right hip pain   EXAM: DG HIP (WITH OR WITHOUT PELVIS) 2-3V RIGHT   COMPARISON:  None Available.   FINDINGS: There is a right femoral neck fracture with varus angulation. No subluxation or dislocation. Remote hardware noted in the left hip.   IMPRESSION: Right femoral neck fracture with varus angulation.     Electronically Signed   By: Franky Crease M.D.  ROS: As per HPI  Blood pressure (!) 157/86, pulse 78, temperature 97.6 F (36.4 C), temperature source Oral, resp. rate 14, height 5' 7 (1.702 m), weight 78.3 kg, SpO2 99%.  Physical Exam: General:  Appears calm and comfortable and is in NAD. Pleasantly confuse.d  Eyes:  PERRL, EOMI, normal lids, iris ENT:  grossly normal hearing, lips & tongue, mmm; edentulous Neck:  no LAD, masses or thyromegaly; no carotid bruits Cardiovascular:  RRR, no m/r/g. No LE edema.  Respiratory:   CTA bilaterally with no wheezes/rales/rhonchi.  Normal respiratory effort. Abdomen:  soft, NT, ND, NABS Back:   normal alignment, no  CVAT Skin:  no rash or induration seen on limited exam Musculoskeletal: right leg: shortened and abducted. Pedal pulses intact. Sensation intact. No edema in ankle/knee joints. No bruising over hip or rest of leg.  Lower extremity:  No LE edema.  Limited foot exam with no ulcerations.  2+ distal pulses. Psychiatric:  grossly normal mood and affect, speech fluent, but tangential. Pleasantly confused, AO to self only  Neurologic:  CN 2-12 grossly intact, moves all extremities in coordinated fashion, sensation intact  Assessment/Plan: 1.  Right proximal femur fracture, femoral neck with involvement of the greater trochanter  Plan: Right hip fracture with a mildly complex pattern indeterminate if it is a true intertrochanteric femur fracture versus femoral neck fracture.  She will need operative repair of this.  At this point I am considering trying to fix this with a trochanteric nail based on the size of the greater trochanteric segment. N.p.o after  midnight on 06/19/2024 for potential procedure on the same day. Orders will be placed for procedure. I will speak to her daughter regarding the plan  [name] 06/19/2024, 6:57 AM

## 2024-06-19 NOTE — Progress Notes (Signed)
 [facility]  PROGRESS NOTE  April Tanner FMW:[id number] DOB: 1941-02-10 DOA: 06/18/2024 PCP: Leontine Cramp, NP   Brief HPI:    83 y.o. female with medical history significant of HTN who presented to the ED with complaints of hip pain after a fall. She can't tell me what happened. Her daughter wasn't there either, but from what she can gather there was a dead kitten in the road and she went to pick it up and lost her balance and fell onto her right hip    Assessment/Plan:    Closed displaced fracture of right femoral neck (HCC) 83 year old presenting to ED after mechanical fall and right hip pain found to have a right femoral neck fracture with varus angulation  -Orthopedics consulted, plan for surgery today. -type and screen, UA, INR and vitamin D    Leukocytosis Likely reactive, no signs/sx of infection Resolved  Acute metabolic encephalopathy Resolved -CT head with no acute findings -check metabolic w/u: hepatic panel within normal limit, TSH 1.069, ammonia level 14, B12 level 240, UDS  - CXR showed no active disease, UA was clear -daughter states she has had recent cognitive decline with memory loss  -fall/delirium precautions    Accelerated hypertension Currently on norvasc  10mg  daily and losartan 100mg  daily and lasix 20mg  daily  Has a bottle of atenolol  25mg , but this has not been filled since December of 2024 (hold this)  Her daughter doesn't think she is taking any of her medication  Continue norvasc  and start back losartan at 50mg .  -Blood pressure has been high, will start atenolol  25 mg daily    Anxiety Continue 75mg  of zoloft  daily       Medications     amLODipine   10 mg Oral Daily   docusate sodium   100 mg Oral BID   losartan  50 mg Oral Daily   sertraline   75 mg Oral QHS     Data Reviewed:   CBG:  No results for input(s): GLUCAP in the last 168 hours.  SpO2: 99 % O2 Flow Rate (L/min): 2 L/min    Vitals:   06/18/24 1914 06/18/24  1922 06/19/24 0137 06/19/24 0610  BP:  (!) 175/94 (!) 165/87 (!) 157/86  Pulse:  83 84 78  Resp:  17 15 14   Temp:  98.3 F (36.8 C) 97.6 F (36.4 C) 97.6 F (36.4 C)  TempSrc:  Oral Oral Oral  SpO2:  98% 97% 99%  Weight: 78.3 kg     Height: 5' 7 (1.702 m)         Data Reviewed:  Basic Metabolic Panel: Recent Labs  Lab 06/18/24 1534 06/19/24 0330  NA 133* 137  K 3.5 4.3  CL 102 104  CO2 22 25  GLUCOSE 189* 141*  BUN 14 16  CREATININE 0.66 0.75  CALCIUM 9.0 9.3    CBC: Recent Labs  Lab 06/18/24 1534 06/19/24 0330  WBC 12.4* 9.7  NEUTROABS 10.7*  --   HGB 12.6 11.7*  HCT 37.9 37.8  MCV 96.2 101.6*  PLT 211 226    LFT Recent Labs  Lab 06/18/24 2028  AST 16  ALT 12  ALKPHOS 97  BILITOT 0.7  PROT 6.8  ALBUMIN  3.5     Antibiotics: Anti-infectives (From admission, onward)    None        DVT prophylaxis: Per orthopedics  Code Status: Full code  Family Communication:    CONSULTS    Subjective   Complains of pain  Objective    Physical Examination:   General-appears in no acute distress Heart-S1-S2, regular, no murmur auscultated Lungs-clear to auscultation bilaterally, no wheezing or crackles auscultated Abdomen-soft, nontender, no organomegaly Extremities-no edema in the lower extremities Neuro-alert, oriented x3, no focal deficit noted  Status is: Inpatient:             Sabas GORMAN Brod   [facility] If 7PM-7AM, please contact night-coverage at [email or web address], Office  [phone]   06/19/2024, 7:25 AM  LOS: 1 day

## 2024-06-19 NOTE — Brief Op Note (Signed)
 06/18/2024 - 06/19/2024  5:01 PM  PATIENT:  April Tanner  83 y.o. female  PRE-OPERATIVE DIAGNOSIS:  RIGHT HIP FRACTURE  POST-OPERATIVE DIAGNOSIS:  RIGHT HIP FRACTURE  PROCEDURE:  Procedure(s): FIXATION, FRACTURE, INTERTROCHANTERIC, WITH INTRAMEDULLARY ROD (Right)  SURGEON:  Surgeons and Role:    DEWAINE Ernie Cough, MD - Primary  PHYSICIAN ASSISTANT: Rosina Calin, PA-C  ANESTHESIA:   general  EBL:  50 mL   BLOOD ADMINISTERED:none  DRAINS: none   LOCAL MEDICATIONS USED:  NONE  SPECIMEN:  No Specimen  DISPOSITION OF SPECIMEN:  N/A  COUNTS:  YES  TOURNIQUET:  * No tourniquets in log *  DICTATION: .Other Dictation: Dictation Number [id number]  PLAN OF CARE: Admit to inpatient   PATIENT DISPOSITION:  PACU - hemodynamically stable.   Delay start of Pharmacological VTE agent (>24hrs) due to surgical blood loss or risk of bleeding: no

## 2024-06-19 NOTE — Anesthesia Preprocedure Evaluation (Addendum)
 Anesthesia Evaluation  Patient identified by MRN, date of birth, ID band Patient awake    Reviewed: Allergy & Precautions, H&P , NPO status , Patient's Chart, lab work & pertinent test results, reviewed documented beta blocker date and time   History of Anesthesia Complications Negative for: history of anesthetic complications  Airway Mallampati: I  TM Distance: >3 FB Neck ROM: Full    Dental no notable dental hx. (+) Edentulous Upper, Edentulous Lower   Pulmonary neg pulmonary ROS   Pulmonary exam normal breath sounds clear to auscultation       Cardiovascular hypertension, Pt. on medications and Pt. on home beta blockers (-) angina  Rhythm:Regular Rate:Normal     Neuro/Psych   Anxiety     negative neurological ROS     GI/Hepatic Neg liver ROS,GERD  Controlled,,  Endo/Other  negative endocrine ROS    Renal/GU negative Renal ROS  negative genitourinary   Musculoskeletal   Abdominal   Peds  Hematology negative hematology ROS (+)   Anesthesia Other Findings   Reproductive/Obstetrics negative OB ROS                             Anesthesia Physical Anesthesia Plan  ASA: 2  Anesthesia Plan: General   Post-op Pain Management: Tylenol  PO (pre-op)*   Induction: Intravenous  PONV Risk Score and Plan: 4 or greater and Ondansetron , Dexamethasone  and Treatment may vary due to age or medical condition  Airway Management Planned: Oral ETT  Additional Equipment: None  Intra-op Plan:   Post-operative Plan: Extubation in OR  Informed Consent: I have reviewed the patients History and Physical, chart, labs and discussed the procedure including the risks, benefits and alternatives for the proposed anesthesia with the patient or authorized representative who has indicated his/her understanding and acceptance.     Dental advisory given  Plan Discussed with: CRNA and Surgeon  Anesthesia Plan  Comments:        Anesthesia Quick Evaluation

## 2024-06-19 NOTE — Progress Notes (Signed)
 Initial Nutrition Assessment  DOCUMENTATION CODES:   Not applicable  INTERVENTION:  - Recommend Regular diet after OR today.  - Ensure Plus High Protein po BID once diet advanced. Each supplement provides 350 kcal and 20 grams of protein. - Multivitamin with minerals daily - Monitor weight trends.   NUTRITION DIAGNOSIS:   Increased nutrient needs related to hip fracture as evidenced by estimated needs.  GOAL:   Patient will meet greater than or equal to 90% of their needs  MONITOR:   PO intake, Supplement acceptance, Weight trends  REASON FOR ASSESSMENT:   Consult Assessment of nutrition requirement/status, Hip fracture protocol  ASSESSMENT:   83 y.o. female with medical history significant of HTN who presented with complaints of hip pain after a fall and admitted for right hip fracture.  Patient in bed at time of visit, daughter at bedside.  UBW reported to be 177# and both report weight has been mostly stable with no significant changes recently.   Patient typically eating 2 meals a day at home (breakfast and lunch) and occasionally has a late night snack (bowl of cereal). Eating normally PTA. Takes a multivitamin1650-1950 kc at home.   Current appetite is very good. Patient hungry but NPO for OR today. Discussed increased nutrient needs to promote healing. Patient agreeable to receive Ensure during admission.    Medications reviewed and include: Colace  Labs reviewed:  -   NUTRITION - FOCUSED PHYSICAL EXAM:  Flowsheet Row Most Recent Value  Orbital Region Mild depletion  Upper Arm Region No depletion  Thoracic and Lumbar Region No depletion  Buccal Region No depletion  Temple Region Mild depletion  Clavicle Bone Region No depletion  Clavicle and Acromion Bone Region No depletion  Scapular Bone Region Unable to assess  Dorsal Hand Mild depletion  Patellar Region No depletion  Anterior Thigh Region No depletion  Posterior Calf Region No depletion  Edema  (RD Assessment) None  Hair Reviewed  Eyes Reviewed  Mouth Reviewed  Skin Reviewed  Nails Reviewed    Diet Order:   Diet Order             Diet NPO time specified Except for: Sips with Meds  Diet effective now                   EDUCATION NEEDS:  Education needs have been addressed  Skin:  Skin Assessment: Reviewed RN Assessment  Last BM:  6/22  Height:  Ht Readings from Last 1 Encounters:  06/19/24 5' 7 (1.702 m)   Weight:  Wt Readings from Last 1 Encounters:  06/19/24 78.3 kg   Ideal Body Weight:  61.36 kg  BMI:  Body mass index is 27.04 kg/m.  Estimated Nutritional Needs:  Kcal:  1550-1700 kcals Protein:  80-95 grams Fluid:  >/= 1.6L    Trude Ned RD, LDN Contact via Secure Chat.

## 2024-06-19 NOTE — Interval H&P Note (Signed)
 History and Physical Interval Note:  06/19/2024 3:16 PM  April Tanner  has presented today for surgery, with the diagnosis of RIGHT HIP FRACTURE.  The various methods of treatment have been discussed with the patient and family. After consideration of risks, benefits and other options for treatment, the patient has consented to  Procedure(s): FIXATION, FRACTURE, INTERTROCHANTERIC, WITH INTRAMEDULLARY ROD (Right) ARTHROPLASTY, HIP, TOTAL, ANTERIOR APPROACH (Right) as a surgical intervention.  The patient's history has been reviewed, patient examined, no change in status, stable for surgery.  I have reviewed the patient's chart and labs.  Questions were answered to the patient's satisfaction.     [name]

## 2024-06-19 NOTE — Discharge Instructions (Addendum)

## 2024-06-19 NOTE — Anesthesia Postprocedure Evaluation (Signed)
 Anesthesia Post Note  Patient: April Tanner  Procedure(s) Performed: FIXATION, FRACTURE, INTERTROCHANTERIC, WITH INTRAMEDULLARY ROD (Right: Hip)     Patient location during evaluation: PACU Anesthesia Type: General Level of consciousness: awake and alert, patient cooperative and oriented Pain management: pain level controlled Vital Signs Assessment: post-procedure vital signs reviewed and stable Respiratory status: spontaneous breathing, nonlabored ventilation and respiratory function stable Cardiovascular status: blood pressure returned to baseline and stable Postop Assessment: no apparent nausea or vomiting Anesthetic complications: no  No notable events documented.  Last Vitals:  Vitals:   06/19/24 1738 06/19/24 1745  BP:  (!) 162/86  Pulse: 76 76  Resp: 15 12  Temp:    SpO2: 96% 93%    Last Pain:  Vitals:   06/19/24 1420  TempSrc:   PainSc: 0-No pain    LLE Motor Response: Purposeful movement;Responds to commands (06/19/24 1745)   RLE Motor Response: Purposeful movement;Responds to commands (06/19/24 1745)        [name],E. Denaja Verhoeven

## 2024-06-19 NOTE — Op Note (Unsigned)
 NAME: April Tanner, LAUGHMAN MEDICAL RECORD NO: [id number] ACCOUNT NO: [id number] DATE OF BIRTH: 09/21/1941 FACILITY: [facility] LOCATION: [facility] PHYSICIAN: Donnice BIRCH. Ernie, MD  Operative Report   DATE OF PROCEDURE: 06/19/2024  PREOPERATIVE DIAGNOSIS:  Right complex comminuted proximal intertrochanteric/peritrochanteric femur fracture.  POSTOPERATIVE DIAGNOSIS: Right complex comminuted proximal intertrochanteric/peritrochanteric femur fracture.  PROCEDURE:  Trochanteric femoral nailing of right intertrochanteric femur fracture using a [employer] Affixus nail 11 x 180 mm with a 95-mm lag screw and a distal interlock.  SURGEON:  Donnice BIRCH. Ernie, MD  ASSISTANT:  Rosina Calin, PA-C.  Note that Ms. Calin was present for the entirety of the case from preoperative positioning, preoperative management of the operative extremity, general facilitation of the case, maintenance of reduction during the  procedure, and primary wound closure.  ANESTHESIA:  General.  BLOOD LOSS:  50 mL.  DRAINS:  None.  COMPLICATIONS:  None.  INDICATIONS FOR THE PROCEDURE:  The patient is an 83 year old female who had a ground-level fall.  She had a remote history 3 years ago of a left intertrochanteric femur fracture treated with a trochanteric femoral nail.  Review of her radiographs from  the emergency room revealed a complex pattern but definitely noted that the greater trochanter was fractured off.  It was uncertain the extension; however, the fracture appeared to come out through the more medial on the femoral neck.  Based on the  segment of the greater trochanter, it was felt that it was best to perform an intramedullary nail.  I had posted her for an intramedullary nail versus total hip replacement with a plan to do traction radiographs to better ascertain the pattern of the  fracture.  I reviewed this with her daughter.  We reviewed risks of malunion, nonunion, and need for future surgeries.  Discussed the  rationale of the intraoperative decision making.  Consent was obtained for management of her fracture.  DESCRIPTION OF PROCEDURE:  The patient was brought to the operative theater.  Once adequate anesthesia, preoperative antibiotics were administered, she was first positioned on the Hana table with the right foot placed in a traction boot.  I then applied  traction through the perineal post and obtained radiographs.  Again, the fracture pattern was difficult to completely classify, but it was noted that the greater trochanter was off.  It almost appeared that the inferior aspect of her femoral neck was  somewhat locked into the fracture site.  At this point, I did elect to proceed with intramedullary nailing with the goal of trying to capture the greater trochanter and provide stability for her gluteus tendons.  At this point, we positioned her left leg  into a flexed and abducted position with bony prominences padded particularly over the peroneal nerve.  We then prepped and draped the right hip in a sterile fashion using shower curtain technique.  A timeout was performed identifying the patient's  planned procedure extremity.  Fluoroscopy was brought back to the field and the tip of the trochanter was identified.  I made an incision proximal to this.  I then incised through the gluteal fascia.  A guidewire was then positioned in the tip of the  trochanter noting that it was posteriorly displaced.  I then passed a guidewire across the fracture site and confirmed this radiographically.  I then opened up the proximal femur with a drill.  We then passed the 11 x 180 mm nail across the fracture  site.  At this point, I utilized the proximal incision to  use a Cobb elevator to help reduce the neck segment into a near anatomic position.  We confirmed this in both the AP and lateral planes and once this was done, I placed a guidewire into the center  of the femoral head, confirmed in the AP and lateral planes that  it was through bone in the neck segment into the femoral head.  Once we did this, I initially measured and selected a 105-mm lag screw; however, when I passed this screw, I then applied  some compression, it lengthened the lag screw more than I wanted.  I thus removed the lag screw and chose a 95-mm screw.  We then placed this screw and again applied some compression with the traction off the lower extremity.  Based on the significant  comminution and complex pattern of the fracture, I elected to make this a fixed angle device.  We locked the locking bolt proximally.  Once this was done, we placed a distal interlock through the insertion jig.  Once this was done, all wounds were  irrigated.  Final radiographs were obtained.  We reapproximated the gluteal fascia and the iliotibial band with #1 Vicryl.  The remainder of the wounds were closed with 2-0 Vicryl and running Monocryl stitch.  The wounds were clean, dry, and dressed  sterile using surgical glue and Aquacel dressing.  She was brought to the recovery room in stable condition, tolerating the procedure well.  Postoperatively, we will limit her weightbearing as much as possible.  She does have a history of dementia.  She should be partial weightbearing for 6-12 weeks based on this complex fracture pattern and the fixation obtained.  Findings were reviewed with  her daughter.   PUS D: 06/19/2024 5:09:39 pm T: 06/19/2024 7:32:00 pm  JOB: [id number]/ [id number]

## 2024-06-19 NOTE — Progress Notes (Signed)
 SLP Cancellation Note  Patient Details Name: April Tanner MRN: [id number] DOB: 08/26/1941   Cancelled treatment:       Reason Eval/Treat Not Completed: Patient at procedure or test/unavailable  Norleen IVAR Blase, MA, CCC-SLP Speech Therapy

## 2024-06-19 NOTE — Anesthesia Procedure Notes (Signed)
 Procedure Name: Intubation Date/Time: 06/19/2024 3:41 PM  Performed by: Levander Lani BIRCH, CRNAPre-anesthesia Checklist: Patient identified, Emergency Drugs available, Suction available, Patient being monitored and Timeout performed Patient Re-evaluated:Patient Re-evaluated prior to induction Oxygen Delivery Method: Circle system utilized Preoxygenation: Pre-oxygenation with 100% oxygen Induction Type: IV induction Ventilation: Mask ventilation without difficulty Laryngoscope Size: Mac and 3 Grade View: Grade I Tube type: Oral Tube size: 7.5 mm Number of attempts: 1 Airway Equipment and Method: Stylet Secured at: 22 cm Tube secured with: Tape Dental Injury: Teeth and Oropharynx as per pre-operative assessment

## 2024-06-20 ENCOUNTER — Encounter (HOSPITAL_COMMUNITY): Payer: Self-pay | Admitting: Orthopedic Surgery

## 2024-06-20 DIAGNOSIS — G9341 Metabolic encephalopathy: Secondary | ICD-10-CM | POA: Diagnosis not present

## 2024-06-20 DIAGNOSIS — S72001A Fracture of unspecified part of neck of right femur, initial encounter for closed fracture: Secondary | ICD-10-CM | POA: Diagnosis not present

## 2024-06-20 DIAGNOSIS — F419 Anxiety disorder, unspecified: Secondary | ICD-10-CM

## 2024-06-20 DIAGNOSIS — I10 Essential (primary) hypertension: Secondary | ICD-10-CM | POA: Diagnosis not present

## 2024-06-20 DIAGNOSIS — D72829 Elevated white blood cell count, unspecified: Secondary | ICD-10-CM | POA: Diagnosis not present

## 2024-06-20 LAB — BASIC METABOLIC PANEL WITH GFR
Anion gap: 9 (ref 5–15)
BUN: 14 mg/dL (ref 8–23)
CO2: 26 mmol/L (ref 22–32)
Calcium: 9.4 mg/dL (ref 8.9–10.3)
Chloride: 102 mmol/L (ref 98–111)
Creatinine, Ser: 0.72 mg/dL (ref 0.44–1.00)
GFR, Estimated: >60 mL/min (ref >60)
Glucose, Bld: 134 mg/dL — ABNORMAL HIGH (ref 70–99)
Potassium: 4.2 mmol/L (ref 3.5–5.1)
Sodium: 137 mmol/L (ref 135–145)

## 2024-06-20 LAB — CBC
HCT: 31.8 % — ABNORMAL LOW (ref 36.0–46.0)
Hemoglobin: 10 g/dL — ABNORMAL LOW (ref 12.0–15.0)
MCH: 31.6 pg (ref 26.0–34.0)
MCHC: 31.4 g/dL (ref 30.0–36.0)
MCV: 100.6 fL — ABNORMAL HIGH (ref 80.0–100.0)
Platelets: 209 10*3/uL (ref 150–400)
RBC: 3.16 MIL/uL — ABNORMAL LOW (ref 3.87–5.11)
RDW: 11.6 % (ref 11.5–15.5)
WBC: 10.2 10*3/uL (ref 4.0–10.5)
nRBC: 0 % (ref 0.0–0.2)

## 2024-06-20 NOTE — NC FL2 (Signed)
 [place]  MEDICAID FL2 LEVEL OF CARE FORM     IDENTIFICATION  Patient Name: April Tanner Birthdate: 15-Dec-1941 Sex: female Admission Date (Current Location): 06/18/2024  [facility] and [place] Number:  Producer, television/film/video and Address:  [facility],  [address], [place] [address]      Provider Number: [id number]  Attending Physician Name and Address:  Drusilla Sabas RAMAN, MD  Relative Name and Phone Number:  Farrell Neptune (Daughter)  [phone] [facility])    Current Level of Care: Hospital Recommended Level of Care: Skilled Nursing Facility Prior Approval Number:    Date Approved/Denied:   PASRR Number: [id number]  Discharge Plan: SNF    Current Diagnoses: Patient Active Problem List   Diagnosis Date Noted   Closed displaced fracture of right femoral neck (HCC) 06/18/2024   Leukocytosis 06/18/2024   Acute metabolic encephalopathy 06/18/2024   Closed fracture of left hip (HCC) 11/26/2020   Accelerated hypertension 10/12/2020   Hyperlipidemia 10/12/2020   Anxiety 10/12/2020   Displaced intertrochanteric fracture of left femur, initial encounter for closed fracture (HCC) 10/12/2020    Orientation RESPIRATION BLADDER Height & Weight     Self, Place  Normal Incontinent, External catheter Weight: 78.3 kg Height:  5' 7 (170.2 cm)  BEHAVIORAL SYMPTOMS/MOOD NEUROLOGICAL BOWEL NUTRITION STATUS      Continent Diet (regular)  AMBULATORY STATUS COMMUNICATION OF NEEDS Skin   Extensive Assist Verbally Surgical wounds (Right Total Hip Arthroplasty)                       Personal Care Assistance Level of Assistance  Bathing, Feeding, Dressing Bathing Assistance: Limited assistance Feeding assistance: Limited assistance Dressing Assistance: Limited assistance     Functional Limitations Info  Sight, Hearing, Speech Sight Info: Impaired (eyeglasses) Hearing Info: Adequate Speech Info: Adequate    SPECIAL CARE FACTORS FREQUENCY  PT (By licensed  PT), OT (By licensed OT)     PT Frequency: 5x/wk OT Frequency: 5x/wk            Contractures Contractures Info: Not present    Additional Factors Info  Code Status, Allergies, Psychotropic Code Status Info: Full Code Allergies Info: NKA Psychotropic Info: Zoloft  75mg  po daily         Current Medications (06/20/2024):  This is the current hospital active medication list Current Facility-Administered Medications  Medication Dose Route Frequency Provider Last Rate Last Admin   0.9 %  sodium chloride  infusion   Intravenous Continuous Donovan, Ashley R, PA-C 75 mL/hr at 06/20/24 1508 New Bag at 06/20/24 1508   acetaminophen  (TYLENOL ) tablet 1,000 mg  1,000 mg Oral Q6H Donovan, Ashley R, PA-C   1,000 mg at 06/20/24 0556   acetaminophen  (TYLENOL ) tablet 325-650 mg  325-650 mg Oral Q6H PRN Patti Rosina SAUNDERS, PA-C       alum & mag hydroxide-simeth (MAALOX/MYLANTA) 200-200-20 MG/5ML suspension 30 mL  30 mL Oral Q4H PRN Patti Rosina SAUNDERS, PA-C       amLODipine  (NORVASC ) tablet 10 mg  10 mg Oral Daily Patti Rosina SAUNDERS, PA-C   10 mg at 06/20/24 1036   aspirin chewable tablet 81 mg  81 mg Oral BID Patti Rosina SAUNDERS, PA-C   81 mg at 06/20/24 1036   atenolol  (TENORMIN ) tablet 25 mg  25 mg Oral Daily Patti Rosina SAUNDERS, PA-C   25 mg at 06/20/24 1036   bisacodyl (DULCOLAX) suppository 10 mg  10 mg Rectal Daily PRN Patti Rosina SAUNDERS, PA-C  diphenhydrAMINE (BENADRYL) 12.5 MG/5ML elixir 12.5-25 mg  12.5-25 mg Oral Q4H PRN Patti Rosina SAUNDERS, PA-C   25 mg at 06/19/24 2232   docusate sodium  (COLACE) capsule 100 mg  100 mg Oral BID Donovan, Ashley R, PA-C   100 mg at 06/20/24 1036   feeding supplement (ENSURE PLUS HIGH PROTEIN) liquid 237 mL  237 mL Oral BID BM Drusilla Sabas RAMAN, MD   237 mL at 06/20/24 1510   hydrALAZINE (APRESOLINE) injection 10 mg  10 mg Intravenous Q4H PRN Patti Rosina SAUNDERS, PA-C       HYDROmorphone  (DILAUDID ) injection 0.5 mg  0.5 mg Intravenous Q4H PRN Patti Rosina SAUNDERS, PA-C        losartan (COZAAR) tablet 50 mg  50 mg Oral Daily Patti Rosina SAUNDERS, PA-C   50 mg at 06/20/24 1039   menthol -cetylpyridinium (CEPACOL) lozenge 3 mg  1 lozenge Oral PRN Patti Rosina SAUNDERS, PA-C       Or   phenol (CHLORASEPTIC) mouth spray 1 spray  1 spray Mouth/Throat PRN Patti Rosina SAUNDERS, PA-C       methocarbamol  (ROBAXIN ) tablet 500 mg  500 mg Oral Q6H PRN Patti Rosina SAUNDERS, PA-C   500 mg at 06/19/24 2351   Or   methocarbamol  (ROBAXIN ) injection 500 mg  500 mg Intravenous Q6H PRN Patti Rosina SAUNDERS, PA-C       metoCLOPramide (REGLAN) tablet 5-10 mg  5-10 mg Oral Q8H PRN Patti Rosina SAUNDERS, PA-C       Or   metoCLOPramide (REGLAN) injection 5-10 mg  5-10 mg Intravenous Q8H PRN Patti Rosina SAUNDERS, PA-C       multivitamin with minerals tablet 1 tablet  1 tablet Oral Daily Lama, Gagan S, MD   1 tablet at 06/20/24 1036   ondansetron  (ZOFRAN ) tablet 4 mg  4 mg Oral Q6H PRN Patti Rosina SAUNDERS, PA-C       Or   ondansetron  (ZOFRAN ) injection 4 mg  4 mg Intravenous Q6H PRN Patti Rosina SAUNDERS, PA-C       oxyCODONE  (Oxy IR/ROXICODONE ) immediate release tablet 2.5-5 mg  2.5-5 mg Oral Q4H PRN Patti Rosina SAUNDERS, PA-C   5 mg at 06/20/24 1035   oxyCODONE  (Oxy IR/ROXICODONE ) immediate release tablet 5-10 mg  5-10 mg Oral Q4H PRN Patti Rosina SAUNDERS, PA-C       polyethylene glycol (MIRALAX  / GLYCOLAX ) packet 17 g  17 g Oral BID Patti Rosina SAUNDERS, PA-C   17 g at 06/20/24 1035   senna (SENOKOT) tablet 17.2 mg  2 tablet Oral QHS Patti Rosina SAUNDERS, PA-C   17.2 mg at 06/19/24 2231   sertraline  (ZOLOFT ) tablet 75 mg  75 mg Oral QHS Donovan, Ashley R, PA-C   75 mg at 06/19/24 2231     Discharge Medications: Please see discharge summary for a list of discharge medications.  Relevant Imaging Results:  Relevant Lab Results:   Additional Information SSN: [id number]  Alfonse SAUNDERS Rex, RN

## 2024-06-20 NOTE — Evaluation (Signed)
 Clinical/Bedside Swallow Evaluation Patient Details  Name: April Tanner MRN: [id number] Date of Birth: 09-Mar-1941  Today's Date: 06/20/2024 Time: SLP Start Time (ACUTE ONLY): 1520 SLP Stop Time (ACUTE ONLY): 1549 SLP Time Calculation (min) (ACUTE ONLY): 29 min  Past Medical History:  Past Medical History:  Diagnosis Date   Hypertension    Past Surgical History:  Past Surgical History:  Procedure Laterality Date   ABDOMINAL HYSTERECTOMY     FEMUR IM NAIL Left 10/12/2020   Procedure: INTRAMEDULLARY (IM) NAIL FEMORAL;  Surgeon: Jerri Kay HERO, MD;  [facility]: [facility];  Service: Orthopedics;  Laterality: Left;   INTRAMEDULLARY (IM) NAIL INTERTROCHANTERIC Right 06/19/2024   Procedure: FIXATION, FRACTURE, INTERTROCHANTERIC, WITH INTRAMEDULLARY ROD;  Surgeon: Ernie Cough, MD;  Location: [facility];  Service: Orthopedics;  Laterality: Right;   THYROIDECTOMY     HPI:  83 yo female adm to [facility] after falling on her right hip recovering kitten from the road. Swallow eval ordered s/p right hip surgery.  PMH + for HLD, anxiety.  Swallow evaluation ordered.    Assessment / Plan / Recommendation  Clinical Impression  Pt presents with functional oropharyngeal swallow based on clinical swallow evaluation. No s/s of aspiration nor significant retention even with pt being edentulous. She easily passed 3 ounce Yale swallow screen.   Swallow subjectively was swift and timely with intake of water, gingerale, ice cream and cracker.    Admits to occasional issues with liquids causing her to choke but denies this being consistent.  Pt had dentures but states her dog ate them - Encouraged her to consider ordering more if she is unable to meet nutritional needs - i.e. decreased protein to help her heal. She also advises she has a hard time with particulate foods - for which advised she eat more cohesive foods.    Recommend continue diet to allow her to choose food options that will be easiest for her to manage. No  SLP follow up needed.  Thanks for this consult. SLP Visit Diagnosis: Dysphagia, unspecified (R13.10)    Aspiration Risk  Mild aspiration risk    Diet Recommendation Regular;Thin liquid    Liquid Administration via: Cup;Straw Medication Administration: Whole meds with liquid (one at a time) Supervision: Patient able to self feed Compensations: Slow rate;Small sips/bites    Other  Recommendations Oral Care Recommendations: Oral care BID     Assistance Recommended at Discharge  N/a  Functional Status Assessment Patient has had a recent decline in their functional status and demonstrates the ability to make significant improvements in function in a reasonable and predictable amount of time.  Frequency and Duration            Prognosis    N/a    Swallow Study   General Date of Onset: 06/20/24 HPI: 83 yo female adm to [facility] after falling on her right hip recovering kitten from the road. Swallow eval ordered s/p right hip surgery.  PMH + for HLD, anxiety.  Swallow evaluation ordered. Type of Study: Bedside Swallow Evaluation Diet Prior to this Study: Regular;Thin liquids (Level 0) Temperature Spikes Noted: No Respiratory Status: Room air History of Recent Intubation: No Behavior/Cognition: Alert;Cooperative;Pleasant mood Oral Cavity Assessment: Within Functional Limits Oral Care Completed by SLP: No Oral Cavity - Dentition: Edentulous Vision: Functional for self-feeding Self-Feeding Abilities: Able to feed self Patient Positioning: Upright in bed Baseline Vocal Quality: Normal Volitional Cough: Strong Volitional Swallow: Able to elicit    Oral/Motor/Sensory Function Overall Oral Motor/Sensory Function: Within functional limits  Ice Chips Ice chips: Not tested   Thin Liquid Thin Liquid: Within functional limits Presentation: Self Fed;Straw    Nectar Thick Nectar Thick Liquid: Not tested   Honey Thick Honey Thick Liquid: Not tested   Puree Puree: Within functional  limits Presentation: Self Fed;Spoon   Solid     Solid: Within functional limits Presentation: Self Georgiana Nicolas Emmie Jenkins 06/20/2024,4:10 PM   Madelin POUR, MS [facility] SLP Acute Rehab Services Office [phone]

## 2024-06-20 NOTE — Evaluation (Signed)
 Physical Therapy Evaluation Patient Details Name: April Tanner MRN: [id number] DOB: 18-May-1941 Today's Date: 06/20/2024  History of Present Illness  Patient is 83 y.o. female s/p Rt IMN on 06/19/24 due to fall resulting in Rt femoral neck fx. PMH significant for HTN, possible dementia, Lt IMN in 2021.  Clinical Impression  April Tanner is 83 y.o. female admitted with above HPI and diagnosis. Patient is currently limited by functional impairments below (see PT problem list). Patient lives with daughter and is typically ind with no deice with occasional use of rollator or SPC at baseline. Pt's daughter provided home info and PLOF via phone conversation, pt unreliable historian with some memory deficits at baseline and increased confusion on evaluation with pt hallucinating some and tangential unable to follow tasks/cues or answer questions appropriately. On eval pt required Total/Max assist for supine to sit from chair position in bed due to inability to raise trunk from slightly lowered position. Patient required MAX cues and Mod assist to power up to RW, unable to tolerate increased WB on Rt Le and likely not maintaining PWB 50%. Patient will benefit from continued skilled PT interventions to address impairments and progress independence with mobility. Patient will benefit from continued inpatient follow up therapy, <3 hours/day. Acute PT will follow and progress as able.         If plan is discharge home, recommend the following: Two people to help with walking and/or transfers;Two people to help with bathing/dressing/bathroom;Assistance with feeding;Assistance with cooking/housework;Direct supervision/assist for medications management;Assist for transportation;Help with stairs or ramp for entrance;Supervision due to cognitive status   Can travel by private vehicle   No    Equipment Recommendations None recommended by PT  Recommendations for Other Services       Functional Status  Assessment Patient has had a recent decline in their functional status and demonstrates the ability to make significant improvements in function in a reasonable and predictable amount of time.     Precautions / Restrictions Precautions Precautions: Fall Recall of Precautions/Restrictions: Intact Restrictions Weight Bearing Restrictions Per Provider Order: Yes RLE Weight Bearing Per Provider Order: Partial weight bearing RLE Partial Weight Bearing Percentage or Pounds: 50      Mobility  Bed Mobility Overal bed mobility: Needs Assistance Bed Mobility: Supine to Sit, Sit to Supine     Supine to sit: Max assist, HOB elevated, Used rails, +2 for physical assistance, +2 for safety/equipment, Total assist     General bed mobility comments: from chair position with Max+1, total +2 to return to supine    Transfers Overall transfer level: Needs assistance Equipment used: Rolling walker (2 wheels) Transfers: Sit to/from Stand, Bed to chair/wheelchair/BSC Sit to Stand: Mod assist          Lateral/Scoot Transfers: Mod assist, +2 safety/equipment General transfer comment: Mod to power up to RW.    Ambulation/Gait                  Stairs            Wheelchair Mobility     Tilt Bed    Modified Rankin (Stroke Patients Only)       Balance                                             Pertinent Vitals/Pain Pain Assessment Pain Assessment: Faces Faces Pain Scale: Hurts even  more Pain Descriptors / Indicators: Aching, Discomfort, Guarding, Grimacing Pain Intervention(s): Limited activity within patient's tolerance, Monitored during session, Repositioned    Home Living Family/patient expects to be discharged to:: Private residence Living Arrangements: Children (dtr and neice sometimes) Available Help at Discharge: Available 24 hours/day Type of Home: Mobile home Home Access: Stairs to enter Entrance Stairs-Rails: Left;Right;Can reach  both Entrance Stairs-Number of Steps: 8   Home Layout: One level Home Equipment: Grab bars - tub/shower;Educational psychologist (4 wheels) 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 she does her own meds and food. some memory issues but not typically this confused     Extremity/Trunk Assessment   Upper Extremity Assessment Upper Extremity Assessment: Generalized weakness    Lower Extremity Assessment Lower Extremity Assessment: Generalized weakness    Cervical / Trunk Assessment Cervical / Trunk Assessment: Kyphotic  Communication   Communication Communication: Impaired Factors Affecting Communication: Other (comment) (cognition impacting communication)    Cognition Arousal: Alert Behavior During Therapy: WFL for tasks assessed/performed   PT - Cognitive impairments: History of cognitive impairments                         Following commands: Impaired Following commands impaired: Follows one step commands inconsistently, Follows one step commands with increased time     Cueing Cueing Techniques: Verbal cues, Gestural cues     General Comments      Exercises     Assessment/Plan    PT Assessment Patient needs continued PT services  PT Problem List         PT Treatment Interventions DME instruction;Gait training;Functional mobility training;Therapeutic activities;Therapeutic exercise;Balance training;Patient/family education;Wheelchair mobility training;Cognitive remediation;Manual techniques;Modalities    PT Goals (Current goals can be found in the Care Plan section)  Acute Rehab PT Goals PT Goal Formulation: With patient Time For Goal Achievement: 07/04/24 Potential to Achieve  Goals: Good    Frequency Min 3X/week     Co-evaluation               AM-PAC PT 6 Clicks Mobility  Outcome Measure Help needed turning from your back to your side while in a flat bed without using bedrails?: Total Help needed moving from lying on your back to sitting on the side of a flat bed without using bedrails?: A Lot Help needed moving to and from a bed to a chair (including a wheelchair)?: A Lot Help needed standing up from a chair using your arms (e.g., wheelchair or bedside chair)?: A Lot Help needed to walk in hospital room?: Total Help needed climbing 3-5 steps with a railing? : Total 6 Click Score: 9    End of Session Equipment Utilized During Treatment: Gait belt Activity Tolerance: Patient tolerated treatment well Patient left: in bed;with call bell/phone within reach;with bed alarm set Nurse Communication: Mobility status PT Visit Diagnosis: Other abnormalities of gait and mobility (R26.89);Muscle weakness (generalized) (M62.81);Difficulty in walking, not elsewhere classified (R26.2);Other symptoms and signs involving the nervous system (R29.898)    Time: 8644-8561 PT Time Calculation (min) (ACUTE ONLY): 43 min   Charges:   PT Evaluation $PT Eval Moderate Complexity: 1 Mod  PT Treatments $Therapeutic Activity: 23-37 mins PT General Charges $$ ACUTE PT VISIT: 1 Visit         Vernell DONEEN KLEIN, DPT Acute Rehabilitation Services Office [phone]  06/20/24 4:13 PM

## 2024-06-20 NOTE — TOC Progression Note (Signed)
 Transition of Care [facility]) - Progression Note    Patient Details  Name: April Tanner MRN: [id number] Date of Birth: 09/10/1941  Transition of Care [facility]) CM/SW Contact  Alfonse JONELLE Rex, RN Phone Number: 06/20/2024, 4:16 PM  Clinical Narrative:   PT eval completed, recommendation for short term rehab/SNF, NCM call to patient's daughter, Farrell Neptune (Daughter) [phone] [facility]) , introduced self and role of TOC/NCM, dtr agreeable, prefers [facility]. Allean confirmed that patient resides with her and and plan is for patient to return home with daughter after rehab stay. FL2 updated, faxed out for bed offers     Expected Discharge Plan: Home w Home Health Services Barriers to Discharge: Continued Medical Work up  Expected Discharge Plan and Services       Living arrangements for the past 2 months: Mobile Home                                       Social Determinants of Health (SDOH) Interventions SDOH Screenings   Food Insecurity: No Food Insecurity (06/18/2024)  Housing: Low Risk  (06/18/2024)  Transportation Needs: No Transportation Needs (06/18/2024)  Utilities: Not At Risk (06/18/2024)  Social Connections: Moderately Isolated (06/18/2024)  Tobacco Use: Low Risk  (06/19/2024)    Readmission Risk Interventions    06/19/2024    9:45 AM  Readmission Risk Prevention Plan  Post Dischage Appt Complete  Medication Screening Complete  Transportation Screening Complete

## 2024-06-20 NOTE — Progress Notes (Signed)
 [facility]  PROGRESS NOTE  April Tanner FMW:[id number] DOB: 06/16/1941 DOA: 06/18/2024 PCP: Leontine Cramp, NP   Brief HPI:    83 y.o. female with medical history significant of HTN who presented to the ED with complaints of hip pain after a fall. She can't tell me what happened. Her daughter wasn't there either, but from what she can gather there was a dead kitten in the road and she went to pick it up and lost her balance and fell onto her right hip    Assessment/Plan:    Closed displaced fracture of right femoral neck (HCC) -Status post femoral nailing of right intertrochanteric femur fracture 83 year old presenting to ED after mechanical fall and right hip pain found to have a right femoral neck fracture with varus angulation  -Orthopedics following - Vitamin D level 42.91    Leukocytosis Likely reactive, no signs/sx of infection Resolved  Acute metabolic encephalopathy Was confused this morning, likely delirium -CT head with no acute findings --: hepatic panel within normal limit, TSH 1.069, ammonia level 14, B12 level 240, UDS  - CXR showed no active disease, UA was clear -daughter states she has had recent cognitive decline with memory loss  -fall/delirium precautions    Accelerated hypertension Currently on norvasc  10mg  daily and losartan 100mg  daily and lasix 20mg  daily  Has a bottle of atenolol  25mg , but this has not been filled since December of 2024 (hold this)  Her daughter doesn't think she is taking any of her medication  Continue norvasc  and start losartan at 50mg .  -Blood pressure has been high, started on  atenolol  25 mg daily    Anxiety Continue 75mg  of zoloft  daily       Medications     acetaminophen   1,000 mg Oral Q6H   amLODipine   10 mg Oral Daily   aspirin  81 mg Oral BID   atenolol   25 mg Oral Daily   dexamethasone  (DECADRON ) injection  10 mg Intravenous Once   docusate sodium   100 mg Oral BID   feeding supplement  237 mL Oral BID  BM   losartan  50 mg Oral Daily   multivitamin with minerals  1 tablet Oral Daily   polyethylene glycol  17 g Oral BID   senna  2 tablet Oral QHS   sertraline   75 mg Oral QHS     Data Reviewed:   CBG:  No results for input(s): GLUCAP in the last 168 hours.  SpO2: 96 % O2 Flow Rate (L/min): 2 L/min    Vitals:   06/19/24 1833 06/19/24 2145 06/20/24 0121 06/20/24 0511  BP: (!) 156/81 (!) 167/86 139/85 (!) 158/84  Pulse: 85 92 97 90  Resp: 18 17 18 18   Temp: 98.7 F (37.1 C) 98.4 F (36.9 C) 98 F (36.7 C) 98.3 F (36.8 C)  TempSrc: Oral  Oral Oral  SpO2: 98% 95% 95% 96%  Weight:      Height:          Data Reviewed:  Basic Metabolic Panel: Recent Labs  Lab 06/18/24 1534 06/19/24 0330 06/20/24 0338  NA 133* 137 137  K 3.5 4.3 4.2  CL 102 104 102  CO2 22 25 26   GLUCOSE 189* 141* 134*  BUN 14 16 14   CREATININE 0.66 0.75 0.72  CALCIUM 9.0 9.3 9.4    CBC: Recent Labs  Lab 06/18/24 1534 06/19/24 0330 06/20/24 0338  WBC 12.4* 9.7 10.2  NEUTROABS 10.7*  --   --  HGB 12.6 11.7* 10.0*  HCT 37.9 37.8 31.8*  MCV 96.2 101.6* 100.6*  PLT 211 226 209    LFT Recent Labs  Lab 06/18/24 2028  AST 16  ALT 12  ALKPHOS 97  BILITOT 0.7  PROT 6.8  ALBUMIN  3.5     Antibiotics: Anti-infectives (From admission, onward)    Start     Dose/Rate Route Frequency Ordered Stop   06/19/24 2200  ceFAZolin  (ANCEF ) IVPB 2g/100 mL premix        2 g 200 mL/hr over 30 Minutes Intravenous Every 6 hours 06/19/24 1759 06/20/24 0626   06/19/24 1145  ceFAZolin  (ANCEF ) IVPB 2g/100 mL premix        2 g 200 mL/hr over 30 Minutes Intravenous On call to O.R. 06/19/24 1047 06/19/24 1543        DVT prophylaxis: Per orthopedics  Code Status: Full code  Family Communication:    CONSULTS    Subjective   Was confused this morning.  Confusion has improved   Objective    Physical Examination:  Appears in no acute distress S1-S2, regular Abdomen is soft,  nontender Neuro-alert, oriented to place and person only, pleasantly confused  Status is: Inpatient:             Sabas GORMAN Brod   [facility] If 7PM-7AM, please contact night-coverage at [email or web address], Office  [phone]   06/20/2024, 7:48 AM  LOS: 2 days

## 2024-06-20 NOTE — Progress Notes (Signed)
   Subjective: 1 Day Post-Op Procedure(s) (LRB): FIXATION, FRACTURE, INTERTROCHANTERIC, WITH INTRAMEDULLARY ROD (Right) Patient reports pain as mild.   Patient seen in rounds for Dr. Ernie. Patient is resting in bed on exam this morning. She is obviously confused at baseline, but compensates fairly well and can maintain some sort of conversation. Follows directions. We will start therapy today.   Objective: Vital signs in last 24 hours: Temp:  [97.3 F (36.3 C)-98.7 F (37.1 C)] 98.3 F (36.8 C) (06/24 0511) Pulse Rate:  [76-97] 90 (06/24 0511) Resp:  [12-24] 18 (06/24 0511) BP: (139-188)/(68-86) 158/84 (06/24 0511) SpO2:  [93 %-100 %] 96 % (06/24 0511) Weight:  [78.3 kg] 78.3 kg (06/23 1414)  Intake/Output from previous day:  Intake/Output Summary (Last 24 hours) at 06/20/2024 0900 Last data filed at 06/20/2024 0600 Gross per 24 hour  Intake 2253.75 ml  Output 500 ml  Net 1753.75 ml     Intake/Output this shift: No intake/output data recorded.  Labs: Recent Labs    06/18/24 1534 06/19/24 0330 06/20/24 0338  HGB 12.6 11.7* 10.0*   Recent Labs    06/19/24 0330 06/20/24 0338  WBC 9.7 10.2  RBC 3.72* 3.16*  HCT 37.8 31.8*  PLT 226 209   Recent Labs    06/19/24 0330 06/20/24 0338  NA 137 137  K 4.3 4.2  CL 104 102  CO2 25 26  BUN 16 14  CREATININE 0.75 0.72  GLUCOSE 141* 134*  CALCIUM 9.3 9.4   Recent Labs    06/18/24 1757  INR 1.0    Exam: General - Patient is Alert and Confused Extremity - Neurologically intact Sensation intact distally Intact pulses distally Dorsiflexion/Plantar flexion intact Dressing - dressing C/D/I Motor Function - intact, moving foot and toes well on exam.   Past Medical History:  Diagnosis Date   Hypertension     Assessment/Plan: 1 Day Post-Op Procedure(s) (LRB): FIXATION, FRACTURE, INTERTROCHANTERIC, WITH INTRAMEDULLARY ROD (Right) Principal Problem:   Closed displaced fracture of right femoral neck  (HCC) Active Problems:   Accelerated hypertension   Anxiety   Leukocytosis   Acute metabolic encephalopathy  Estimated body mass index is 27.04 kg/m as calculated from the following:   Height as of this encounter: 5' 7 (1.702 m).   Weight as of this encounter: 78.3 kg. Advance diet Up with therapy D/C IV fluids  DVT Prophylaxis - Aspirin PWB 50% if able to maintain based on her cognitive function  Up with PT Hgb stable at [address] PA-C Orthopedic Surgery [phone] 06/20/2024, 9:00 AM

## 2024-06-21 DIAGNOSIS — S72001A Fracture of unspecified part of neck of right femur, initial encounter for closed fracture: Secondary | ICD-10-CM | POA: Diagnosis not present

## 2024-06-21 LAB — CBC
HCT: 31.1 % — ABNORMAL LOW (ref 36.0–46.0)
Hemoglobin: 9.7 g/dL — ABNORMAL LOW (ref 12.0–15.0)
MCH: 31.7 pg (ref 26.0–34.0)
MCHC: 31.2 g/dL (ref 30.0–36.0)
MCV: 101.6 fL — ABNORMAL HIGH (ref 80.0–100.0)
Platelets: 200 10*3/uL (ref 150–400)
RBC: 3.06 MIL/uL — ABNORMAL LOW (ref 3.87–5.11)
RDW: 11.7 % (ref 11.5–15.5)
WBC: 9.6 10*3/uL (ref 4.0–10.5)
nRBC: 0 % (ref 0.0–0.2)

## 2024-06-21 MED ORDER — OXYCODONE HCL 5 MG PO TABS: 2.5000 mg | ORAL_TABLET | ORAL | 0 refills | Status: AC | PRN

## 2024-06-21 MED ORDER — SENNA 8.6 MG PO TABS: 2.0000 | ORAL_TABLET | Freq: Every day | ORAL | 0 refills | Status: AC

## 2024-06-21 MED ORDER — VITAMIN B-12 1000 MCG PO TABS
1000.0000 ug | ORAL_TABLET | Freq: Every day | ORAL | Status: DC
  Administered 2024-06-21 – 2024-06-22 (×2): 1000 ug via ORAL
  Filled 2024-06-21 (×2): qty 1

## 2024-06-21 MED ORDER — POLYETHYLENE GLYCOL 3350 17 G PO PACK: 17.0000 g | PACK | Freq: Two times a day (BID) | ORAL | 0 refills | Status: AC

## 2024-06-21 MED ORDER — METHOCARBAMOL 500 MG PO TABS: 500.0000 mg | ORAL_TABLET | Freq: Four times a day (QID) | ORAL | 2 refills | Status: AC | PRN

## 2024-06-21 MED ORDER — ASPIRIN 81 MG PO CHEW: 81.0000 mg | CHEWABLE_TABLET | Freq: Two times a day (BID) | ORAL | 0 refills | Status: AC

## 2024-06-21 NOTE — Progress Notes (Signed)
   Subjective: 2 Days Post-Op Procedure(s) (LRB): FIXATION, FRACTURE, INTERTROCHANTERIC, WITH INTRAMEDULLARY ROD (Right) Patient reports pain as mild.   Patient seen in rounds by Dr. Ernie. Patient is resting in bed on exam this morning. Patient with baseline dementia. We will continue therapy today.   Objective: Vital signs in last 24 hours: Temp:  [98 F (36.7 C)-98.7 F (37.1 C)] 98.6 F (37 C) (06/25 0602) Pulse Rate:  [68-105] 68 (06/25 0602) Resp:  [17] 17 (06/25 0602) BP: (119-165)/(73-99) 165/76 (06/25 0602) SpO2:  [93 %-96 %] 95 % (06/25 0602)  Intake/Output from previous day:  Intake/Output Summary (Last 24 hours) at 06/21/2024 0759 Last data filed at 06/21/2024 0600 Gross per 24 hour  Intake 2496.09 ml  Output 1600 ml  Net 896.09 ml     Intake/Output this shift: No intake/output data recorded.  Labs: Recent Labs    06/18/24 1534 06/19/24 0330 06/20/24 0338  HGB 12.6 11.7* 10.0*   Recent Labs    06/19/24 0330 06/20/24 0338  WBC 9.7 10.2  RBC 3.72* 3.16*  HCT 37.8 31.8*  PLT 226 209   Recent Labs    06/19/24 0330 06/20/24 0338  NA 137 137  K 4.3 4.2  CL 104 102  CO2 25 26  BUN 16 14  CREATININE 0.75 0.72  GLUCOSE 141* 134*  CALCIUM 9.3 9.4   Recent Labs    06/18/24 1757  INR 1.0    Exam: General - Patient is Alert Extremity - Neurologically intact Sensation intact distally Intact pulses distally Dorsiflexion/Plantar flexion intact Dressing - dressing C/D/I Motor Function - intact, moving foot and toes well on exam.   Past Medical History:  Diagnosis Date   Hypertension     Assessment/Plan: 2 Days Post-Op Procedure(s) (LRB): FIXATION, FRACTURE, INTERTROCHANTERIC, WITH INTRAMEDULLARY ROD (Right) Principal Problem:   Closed displaced fracture of right femoral neck (HCC) Active Problems:   Accelerated hypertension   Anxiety   Leukocytosis   Acute metabolic encephalopathy  Estimated body mass index is 27.04 kg/m as  calculated from the following:   Height as of this encounter: 5' 7 (1.702 m).   Weight as of this encounter: 78.3 kg.   DVT Prophylaxis - Aspirin PWB 50% May bear full weight if needed for transfers only   SNF once placement obtained  Rosina Calin, PA-C Orthopedic Surgery [phone] 06/21/2024, 7:59 AM

## 2024-06-21 NOTE — TOC Progression Note (Addendum)
 Transition of Care [facility]) - Progression Note    Patient Details  Name: April Tanner MRN: [id number] Date of Birth: 04-Jan-1941  Transition of Care [facility]) CM/SW Contact  Alfonse JONELLE Rex, RN Phone Number: 06/21/2024, 1:41 PM  Clinical Narrative:   Met with patient and her daughter, April Tanner, at bedside to review short term rehab/SNF bed offers [facility], [place], [address], [place], [place], [place], [place], [place], [place], [place], [place]), daughter accepted bed offer at [place]. Kitty w/[facility] notified, states she will visit patient for bedside evaluation  -2:00pm Call to [facility], [place] , [place] auth initiated telephonically, REF# [id number], clinical faxed to [phone], await auth.     Expected Discharge Plan: Home w Home Health Services Barriers to Discharge: Continued Medical Work up  Expected Discharge Plan and Services       Living arrangements for the past 2 months: Mobile Home                                       Social Determinants of Health (SDOH) Interventions SDOH Screenings   Food Insecurity: No Food Insecurity (06/18/2024)  Housing: Low Risk  (06/18/2024)  Transportation Needs: No Transportation Needs (06/18/2024)  Utilities: Not At Risk (06/18/2024)  Social Connections: Moderately Isolated (06/18/2024)  Tobacco Use: Low Risk  (06/19/2024)    Readmission Risk Interventions    06/19/2024    9:45 AM  Readmission Risk Prevention Plan  Post Dischage Appt Complete  Medication Screening Complete  Transportation Screening Complete

## 2024-06-21 NOTE — Progress Notes (Signed)
 PROGRESS NOTE  April Tanner  FMW:[id number] DOB: [birth year] DOA: 06/18/2024 PCP: Leontine Cramp, NP   Brief Narrative: Patient is a 83 year old female with history of hypertension who presented to the emergency department with complaint of right hip pain after fall .she lost her balance and fell on her right hip.  Found to have closed displaced fracture of the right femoral neck.  Status post ORIF/femoral nailing by orthopedics on 6/23.  PT/OT recommending SNF on discharge.  Waiting for SNF bed  Assessment & Plan:  Principal Problem:   Closed displaced fracture of right femoral neck (HCC) Active Problems:   Leukocytosis   Acute metabolic encephalopathy   Accelerated hypertension   Anxiety   Closed displaced fracture of right femoral neck: Status post femoral nailing of the right intertrochanteric femur fracture.  Orthopedics was following.  Continue pain management, supportive care.  On aspirin for DVT prophylaxis.  Continue bowel regimen  Acute metabolic encephalopathy: Likely hospital induced delirium.  CT head did not show any acute findings.  Ammonia level normal.  Chest x-rays did not show any acute disease.  UA was clear.  As per the daughter, he has recent cognitive decline with memory loss.  Continue delirium precaution, frequent orientation.  Minimize narcotics  Hypertension: Continue monitoring blood pressure.  Continue current regimen: Norvasc , losartan, Lasix, atenolol   Anxiety: On Zoloft   Vitamin B12 deficiency: Vitamin B12 of 240.  Started on supplementation     Nutrition Problem: Increased nutrient needs Etiology: hip fracture    DVT prophylaxis:SCDs Start: 06/19/24 1800 Place TED hose Start: 06/19/24 1800 SCDs Start: 06/18/24 1843     Code Status: Full Code  Family Communication: None at bedside  Patient status:Inpatient   Patient is from :Home  Anticipated discharge to:SNF  Estimated DC date:whenever possible   Consultants:  Orthopedics  Procedures:ORIF  Antimicrobials:  Anti-infectives (From admission, onward)    Start     Dose/Rate Route Frequency Ordered Stop   06/19/24 2200  ceFAZolin  (ANCEF ) IVPB 2g/100 mL premix        2 g 200 mL/hr over 30 Minutes Intravenous Every 6 hours 06/19/24 1759 06/20/24 0626   06/19/24 1145  ceFAZolin  (ANCEF ) IVPB 2g/100 mL premix        2 g 200 mL/hr over 30 Minutes Intravenous On call to O.R. 06/19/24 1047 06/19/24 1543       Subjective: Patient seen and examined at bedside today.  Hemodynamically stable.  Comfortable.  Lying on bed.  Denies any new complaints.  Oriented to place only.  Not agitated.  Objective: Vitals:   06/20/24 1356 06/20/24 1809 06/20/24 2113 06/21/24 0602  BP: (!) 153/83 (!) 119/95 (!) 149/73 (!) 165/76  Pulse: 73 80 77 68  Resp: 17 17 17 17   Temp: 98.7 F (37.1 C) 98.5 F (36.9 C) 98.4 F (36.9 C) 98.6 F (37 C)  TempSrc:  Oral Oral Oral  SpO2: 93% 96% 96% 95%  Weight:      Height:        Intake/Output Summary (Last 24 hours) at 06/21/2024 1042 Last data filed at 06/21/2024 0900 Gross per 24 hour  Intake 2676.09 ml  Output 1300 ml  Net 1376.09 ml   Filed Weights   06/18/24 1333 06/18/24 1914 06/19/24 1414  Weight: 80.3 kg 78.3 kg 78.3 kg    Examination:  General exam: Overall comfortable, not in distress, pleasantly confused HEENT: PERRL Respiratory system:  no wheezes or crackles  Cardiovascular system: S1 & S2 heard, RRR.  Gastrointestinal system:  Abdomen is nondistended, soft and nontender. Central nervous system: Alert and awake, oriented to place only Extremities: No edema, no clubbing ,no cyanosis, clean surgical wound on the right hip Skin: No rashes, no ulcers,no icterus     Data Reviewed: I have personally reviewed following labs and imaging studies  CBC: Recent Labs  Lab 06/18/24 1534 06/19/24 0330 06/20/24 0338 06/21/24 0735  WBC 12.4* 9.7 10.2 9.6  NEUTROABS 10.7*  --   --   --   HGB 12.6 11.7*  10.0* 9.7*  HCT 37.9 37.8 31.8* 31.1*  MCV 96.2 101.6* 100.6* 101.6*  PLT 211 226 209 200   Basic Metabolic Panel: Recent Labs  Lab 06/18/24 1534 06/19/24 0330 06/20/24 0338  NA 133* 137 137  K 3.5 4.3 4.2  CL 102 104 102  CO2 22 25 26   GLUCOSE 189* 141* 134*  BUN 14 16 14   CREATININE 0.66 0.75 0.72  CALCIUM 9.0 9.3 9.4     Recent Results (from the past 240 hours)  Surgical pcr screen     Status: None   Collection Time: 06/19/24 11:30 AM   Specimen: Nasal Mucosa; Nasal Swab  Result Value Ref Range Status   MRSA, PCR NEGATIVE NEGATIVE Final   Staphylococcus aureus NEGATIVE NEGATIVE Final    Comment: (NOTE) The Xpert SA Assay (FDA approved for NASAL specimens in patients 9 years of age and older), is one component of a comprehensive surveillance program. It is not intended to diagnose infection nor to guide or monitor treatment. Performed at [facility], [address] [place], [place] [address]      Radiology Studies: DG HIP UNILAT WITH PELVIS 2-3 VIEWS RIGHT Result Date: 06/19/2024 CLINICAL DATA:  Elective surgery. EXAM: DG HIP (WITH OR WITHOUT PELVIS) 2-3V RIGHT COMPARISON:  Preoperative imaging FINDINGS: Ten fluoroscopic spot views of the right hip submitted from the operating room. Femoral intramedullary nail with trans trochanteric and distal locking screw fixation traversing proximal femur fracture. Provided fluoroscopy time 1.4 seconds. Dose 23.406 mGy. IMPRESSION: Intraoperative fluoroscopy during proximal femur fracture fixation. Electronically Signed   By: Andrea Gasman M.D.   On: 06/19/2024 18:51   DG C-Arm 1-60 Min-No Report Result Date: 06/19/2024 Fluoroscopy was utilized by the requesting physician.  No radiographic interpretation.   DG C-Arm 1-60 Min-No Report Result Date: 06/19/2024 Fluoroscopy was utilized by the requesting physician.  No radiographic interpretation.    Scheduled Meds:  acetaminophen   1,000 mg Oral Q6H    amLODipine   10 mg Oral Daily   aspirin  81 mg Oral BID   atenolol   25 mg Oral Daily   docusate sodium   100 mg Oral BID   feeding supplement  237 mL Oral BID BM   losartan  50 mg Oral Daily   multivitamin with minerals  1 tablet Oral Daily   polyethylene glycol  17 g Oral BID   senna  2 tablet Oral QHS   sertraline   75 mg Oral QHS   Continuous Infusions:  sodium chloride  75 mL/hr at 06/21/24 0536     LOS: 3 days   Ivonne Mustache, MD [facility] P6/25/2025, 10:42 AM

## 2024-06-21 NOTE — Plan of Care (Signed)

## 2024-06-22 DIAGNOSIS — S72001A Fracture of unspecified part of neck of right femur, initial encounter for closed fracture: Secondary | ICD-10-CM | POA: Diagnosis not present

## 2024-06-22 MED ORDER — LOSARTAN POTASSIUM 50 MG PO TABS
50.0000 mg | ORAL_TABLET | Freq: Every day | ORAL | Status: AC
Start: 2024-06-23 — End: ?

## 2024-06-22 MED ORDER — CYANOCOBALAMIN 1000 MCG PO TABS: 1000.0000 ug | ORAL_TABLET | Freq: Every day | ORAL | Status: AC

## 2024-06-22 NOTE — TOC Progression Note (Signed)
 Transition of Care [facility]) - Progression Note    Patient Details  Name: April Tanner MRN: [id number] Date of Birth: March 28, 1941  Transition of Care [facility]) CM/SW Contact  Alfonse JONELLE Rex, RN Phone Number: 06/22/2024, 11:19 AM  Clinical Narrative:  Call to [facility], [facility] [place], confirmed short term rehab/SNF auth approved. Auth# [id number], days approved 6/26 to 06/24/24, team notified.      Expected Discharge Plan: Home w Home Health Services Barriers to Discharge: Continued Medical Work up  Expected Discharge Plan and Services       Living arrangements for the past 2 months: Mobile Home                                       Social Determinants of Health (SDOH) Interventions SDOH Screenings   Food Insecurity: No Food Insecurity (06/18/2024)  Housing: Low Risk  (06/18/2024)  Transportation Needs: No Transportation Needs (06/18/2024)  Utilities: Not At Risk (06/18/2024)  Social Connections: Moderately Isolated (06/18/2024)  Tobacco Use: Low Risk  (06/19/2024)    Readmission Risk Interventions    06/19/2024    9:45 AM  Readmission Risk Prevention Plan  Post Dischage Appt Complete  Medication Screening Complete  Transportation Screening Complete

## 2024-06-22 NOTE — Progress Notes (Signed)
 Pt expressing concerns about not knowing how to live without a crisis. States, I haven't known a lot of happiness in my life and I'm not sure I know how to live without some sort of crisis. Pt states she is in therapy, nurse to contact daughter about getting in touch with therapist.

## 2024-06-22 NOTE — Plan of Care (Signed)

## 2024-06-22 NOTE — Care Management Important Message (Signed)
 Important Message  Patient Details IM Letter given. Name: April Tanner MRN: [id number] Date of Birth: 09-18-1941   Important Message Given:  Yes - Medicare IM     Melba Ates 06/22/2024, 12:15 PM

## 2024-06-22 NOTE — TOC Transition Note (Signed)
 Transition of Care [facility]) - Discharge Note   Patient Details  Name: April Tanner MRN: [id number] Date of Birth: [birth year]  Transition of Care [facility]) CM/SW Contact:  April JONELLE Rex, RN Phone Number: 06/22/2024, 1:08 PM   Clinical Narrative: DC to [facility],  per [place], admit coordinator, daughter at facility completing admission paperwork. PTAR for transport. No further TOC needs identified at this time.       Final next level of care: Skilled Nursing Facility Barriers to Discharge: Barriers Resolved   Patient Goals and CMS Choice Patient states their goals for this hospitalization and ongoing recovery are:: return home CMS Medicare.gov Compare Post Acute Care list provided to:: Patient Represenative (must comment) April Tanner (Daughter)  [phone] (Mobile)) Choice offered to / list presented to : Adult Children [place] ownership interest in [facility].provided to:: Adult Children    Discharge Placement              Patient [facility]: [facility] Patient to be transferred to facility by: PTAR Name of family member notified: April Tanner (Daughter)  [phone] (Mobile) Patient and family notified of of transfer: 06/22/24  Discharge Plan and Services Additional resources added to the After Visit Summary for                                       Social Drivers of Health (SDOH) Interventions SDOH Screenings   Food Insecurity: No Food Insecurity (06/18/2024)  Housing: Low Risk  (06/18/2024)  Transportation Needs: No Transportation Needs (06/18/2024)  Utilities: Not At Risk (06/18/2024)  Social Connections: Moderately Isolated (06/18/2024)  Tobacco Use: Low Risk  (06/19/2024)     Readmission Risk Interventions    06/22/2024    1:07 PM 06/19/2024    9:45 AM  Readmission Risk Prevention Plan  Post Dischage Appt Complete Complete  Medication Screening Complete Complete  Transportation Screening Complete Complete

## 2024-06-22 NOTE — Discharge Summary (Signed)
 Physician Discharge Summary  April Tanner FMW:[id number] DOB: November 08, 1941 DOA: 06/18/2024  PCP: Leontine Cramp, NP  Admit date: 06/18/2024 Discharge date: 06/22/2024  Admitted From: Home Disposition:  SNF  Discharge Condition:Stable CODE STATUS:FULL Diet recommendation: Heart Healthy  Brief/Interim Summary: Patient is a 83 year old female with history of hypertension who presented to the emergency department with complaint of right hip pain after fall .she lost her balance and fell on her right hip.  Found to have closed displaced fracture of the right femoral neck.  Status post ORIF/femoral nailing by orthopedics on 6/23.  PT/OT recommending SNF on discharge.  Medically stable for discharge to SNF today  Following problems were addressed during the hospitalization:  Closed displaced fracture of right femoral neck: Status post femoral nailing of the right intertrochanteric femur fracture. Continue pain management, supportive care.  On aspirin for DVT prophylaxis.  Continue bowel regimen.  Follow-up with orthopedics in 2 weeks   Acute metabolic encephalopathy: Likely hospital induced delirium.  CT head did not show any acute findings.  Ammonia level normal.  Chest x-rays did not show any acute disease.  UA was clear.  As per the daughter, he has recent cognitive decline with memory loss.  Continue delirium precaution, frequent orientation.  Minimize narcotics   Hypertension: Continue monitoring blood pressure.  Continue current regimen: Norvasc , losartan, Lasix, atenolol    Anxiety: On Zoloft    Vitamin B12 deficiency: Vitamin B12 of 240.  Started on supplementation   Discharge Diagnoses:  Principal Problem:   Closed displaced fracture of right femoral neck (HCC) Active Problems:   Leukocytosis   Acute metabolic encephalopathy   Accelerated hypertension   Anxiety    Discharge Instructions  Discharge Instructions     Diet - low sodium heart healthy   Complete by: As directed     Discharge instructions   Complete by: As directed    1)Please take your medications as instructed 2)Follow up with orthopedic surgery as an outpatient in 2 weeks.   Increase activity slowly   Complete by: As directed    No wound care   Complete by: As directed       Allergies as of 06/22/2024   No Known Allergies      Medication List     STOP taking these medications    aspirin EC 81 MG tablet Replaced by: aspirin 81 MG chewable tablet   enoxaparin  40 MG/0.4ML injection Commonly known as: LOVENOX    HYDROcodone -acetaminophen  5-325 MG tablet Commonly known as: NORCO/VICODIN       TAKE these medications    acetaminophen  650 MG CR tablet Commonly known as: TYLENOL  Take 650 mg by mouth every 8 (eight) hours as needed for pain.   amLODipine  10 MG tablet Commonly known as: NORVASC  Take 10 mg by mouth daily.   aspirin 81 MG chewable tablet Chew 1 tablet (81 mg total) by mouth 2 (two) times daily for 28 days. Replaces: aspirin EC 81 MG tablet   atenolol  25 MG tablet Commonly known as: TENORMIN  Take 25 mg by mouth at bedtime.   cyanocobalamin 1000 MCG tablet Take 1 tablet (1,000 mcg total) by mouth daily. Start taking on: June 23, 2024   furosemide 20 MG tablet Commonly known as: LASIX Take 20 mg by mouth in the morning.   losartan 50 MG tablet Commonly known as: COZAAR Take 1 tablet (50 mg total) by mouth daily. Start taking on: June 23, 2024 What changed:  medication strength how much to take   methocarbamol  500 MG  tablet Commonly known as: ROBAXIN  Take 1 tablet (500 mg total) by mouth every 6 (six) hours as needed for muscle spasms (muscle / thigh pain).   multivitamin tablet Take 1 tablet by mouth daily.   oxyCODONE  5 MG immediate release tablet Commonly known as: Oxy IR/ROXICODONE  Take 0.5-1 tablets (2.5-5 mg total) by mouth every 4 (four) hours as needed for severe pain (pain score 7-10).   polyethylene glycol 17 g packet Commonly known as:  MIRALAX  / GLYCOLAX  Take 17 g by mouth 2 (two) times daily. What changed:  when to take this reasons to take this   senna 8.6 MG Tabs tablet Commonly known as: SENOKOT Take 2 tablets (17.2 mg total) by mouth at bedtime for 14 days.   sertraline  50 MG tablet Commonly known as: ZOLOFT  Take 75 mg by mouth at bedtime.   trimethoprim 100 MG tablet Commonly known as: TRIMPEX Take 100 mg by mouth daily.        Follow-up Information     Ernie Cough, MD. Schedule an appointment as soon as possible for a visit in 2 week(s).   Specialty: Orthopedic Surgery Contact information: [address] [place] [address] [phone]                No Known Allergies  Consultations: Orthopedics   Procedures/Studies: DG HIP UNILAT WITH PELVIS 2-3 VIEWS RIGHT Result Date: 06/19/2024 CLINICAL DATA:  Elective surgery. EXAM: DG HIP (WITH OR WITHOUT PELVIS) 2-3V RIGHT COMPARISON:  Preoperative imaging FINDINGS: Ten fluoroscopic spot views of the right hip submitted from the operating room. Femoral intramedullary nail with trans trochanteric and distal locking screw fixation traversing proximal femur fracture. Provided fluoroscopy time 1.4 seconds. Dose 23.406 mGy. IMPRESSION: Intraoperative fluoroscopy during proximal femur fracture fixation. Electronically Signed   By: Andrea Gasman M.D.   On: 06/19/2024 18:51   DG C-Arm 1-60 Min-No Report Result Date: 06/19/2024 Fluoroscopy was utilized by the requesting physician.  No radiographic interpretation.   DG C-Arm 1-60 Min-No Report Result Date: 06/19/2024 Fluoroscopy was utilized by the requesting physician.  No radiographic interpretation.   DG FEMUR PORT, MIN 2 VIEWS RIGHT Result Date: 06/18/2024 CLINICAL DATA:  Known right hip fracture EXAM: RIGHT FEMUR PORTABLE 2 VIEW COMPARISON:  Film from earlier in the same day. FINDINGS: Femoral neck fracture is again identified with some extension into the intratrochanteric  region. Degenerative changes about the knee joint are seen. No other femoral abnormality is noted. IMPRESSION: Proximal right femoral fracture. Electronically Signed   By: Oneil Devonshire M.D.   On: 06/18/2024 21:49   DG CHEST PORT 1 VIEW Result Date: 06/18/2024 CLINICAL DATA:  Confusion EXAM: PORTABLE CHEST 1 VIEW COMPARISON:  Chest x-ray 11/12/2022 FINDINGS: The heart size and mediastinal contours are within normal limits. Both lungs are clear. The visualized skeletal structures are unremarkable. IMPRESSION: No active disease. Electronically Signed   By: Greig Pique M.D.   On: 06/18/2024 18:52   CT Cervical Spine Wo Contrast Result Date: 06/18/2024 CLINICAL DATA:  Polytrauma, blunt.  Fall. EXAM: CT CERVICAL SPINE WITHOUT CONTRAST TECHNIQUE: Multidetector CT imaging of the cervical spine was performed without intravenous contrast. Multiplanar CT image reconstructions were also generated. RADIATION DOSE REDUCTION: This exam was performed according to the departmental dose-optimization program which includes automated exposure control, adjustment of the mA and/or kV according to patient size and/or use of iterative reconstruction technique. COMPARISON:  11/05/2022 FINDINGS: Alignment: Normal Skull base and vertebrae: No acute fracture. No primary bone lesion or focal  pathologic process. Soft tissues and spinal canal: No prevertebral fluid or swelling. No visible canal hematoma. 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 Moderate to advanced bilateral degenerative facet disease, left greater than right. Multilevel bilateral neural foraminal narrowing. No disc herniation. Upper chest: No acute findings Other: 2 cm left thyroid nodule. IMPRESSION: Degenerative disc and facet disease. No acute bony abnormality. 2 cm left thyroid nodule partially visualized. Recommend further evaluation with non emergent thyroid US  (ref: J Am Coll Radiol. 2015 Feb;12(2):  143-50). Electronically Signed   By: Franky Crease M.D.   On: 06/18/2024 15:36   DG Hip Unilat W or Wo Pelvis 2-3 Views Right Result Date: 06/18/2024 CLINICAL DATA:  Fall, right hip pain EXAM: DG HIP (WITH OR WITHOUT PELVIS) 2-3V RIGHT COMPARISON:  None Available. FINDINGS: There is a right femoral neck fracture with varus angulation. No subluxation or dislocation. Remote hardware noted in the left hip. IMPRESSION: Right femoral neck fracture with varus angulation. Electronically Signed   By: Franky Crease M.D.   On: 06/18/2024 15:34   CT Head Wo Contrast Result Date: 06/18/2024 CLINICAL DATA:  Polytrauma, blunt.  Fall. EXAM: CT HEAD WITHOUT CONTRAST TECHNIQUE: Contiguous axial images were obtained from the base of the skull through the vertex without intravenous contrast. RADIATION DOSE REDUCTION: This exam was performed according to the departmental dose-optimization program which includes automated exposure control, adjustment of the mA and/or kV according to patient size and/or use of iterative reconstruction technique. COMPARISON:  05/17/2024 FINDINGS: Brain: There is atrophy and chronic small vessel disease changes. No acute intracranial abnormality. Specifically, no hemorrhage, hydrocephalus, mass lesion, acute infarction, or significant intracranial injury. Vascular: No hyperdense vessel or unexpected calcification. Skull: No acute calvarial abnormality. Sinuses/Orbits: No acute findings Other: None IMPRESSION: Atrophy, chronic microvascular disease. No acute intracranial abnormality. Electronically Signed   By: Franky Crease M.D.   On: 06/18/2024 15:33      Subjective: Patient seen and examined at the bedside today.  Hemodynamically stable.  Overall comfortable.  Lying on bed.  Denies new complaints.  No bowel movement for last few days.  No complaint of abdominal pain.  Abdomen soft and nondistended.  Medically stable for discharge.I called and discussed discharge planning with her daughter  Josette on phone today  Discharge Exam: Vitals:   06/21/24 2039 06/22/24 0456  BP: (!) 151/68 (!) 148/68  Pulse: 70 68  Resp: 15 15  Temp: 98.4 F (36.9 C) 98.3 F (36.8 C)  SpO2: 96% 95%   Vitals:   06/21/24 0602 06/21/24 1254 06/21/24 2039 06/22/24 0456  BP: (!) 165/76 (!) 147/72 (!) 151/68 (!) 148/68  Pulse: 68 63 70 68  Resp: 17 16 15 15   Temp: 98.6 F (37 C) 97.9 F (36.6 C) 98.4 F (36.9 C) 98.3 F (36.8 C)  TempSrc: Oral     SpO2: 95% 98% 96% 95%  Weight:      Height:        General: Pt is alert, awake, not in acute distress Cardiovascular: RRR, S1/S2 +, no rubs, no gallops Respiratory: CTA bilaterally, no wheezing, no rhonchi Abdominal: Soft, NT, ND, bowel sounds + Extremities: no edema, no cyanosis, surgical wound on the right hip    The results of significant diagnostics from this hospitalization (including imaging, microbiology, ancillary and laboratory) are listed below for reference.     Microbiology: Recent Results (from the past 240 hours)  Surgical pcr screen  Status: None   Collection Time: 06/19/24 11:30 AM   Specimen: Nasal Mucosa; Nasal Swab  Result Value Ref Range Status   MRSA, PCR NEGATIVE NEGATIVE Final   Staphylococcus aureus NEGATIVE NEGATIVE Final    Comment: (NOTE) The Xpert SA Assay (FDA approved for NASAL specimens in patients 54 years of age and older), is one component of a comprehensive surveillance program. It is not intended to diagnose infection nor to guide or monitor treatment. Performed at [facility], [address], [place], [place] [address]      Labs: BNP (last 3 results) No results for input(s): BNP in the last 8760 hours. Basic Metabolic Panel: Recent Labs  Lab 06/18/24 1534 06/19/24 0330 06/20/24 0338  NA 133* 137 137  K 3.5 4.3 4.2  CL 102 104 102  CO2 22 25 26   GLUCOSE 189* 141* 134*  BUN 14 16 14   CREATININE 0.66 0.75 0.72  CALCIUM 9.0 9.3 9.4   Liver Function  Tests: Recent Labs  Lab 06/18/24 2028  AST 16  ALT 12  ALKPHOS 97  BILITOT 0.7  PROT 6.8  ALBUMIN  3.5   No results for input(s): LIPASE, AMYLASE in the last 168 hours. Recent Labs  Lab 06/18/24 2028  AMMONIA 14   CBC: Recent Labs  Lab 06/18/24 1534 06/19/24 0330 06/20/24 0338 06/21/24 0735  WBC 12.4* 9.7 10.2 9.6  NEUTROABS 10.7*  --   --   --   HGB 12.6 11.7* 10.0* 9.7*  HCT 37.9 37.8 31.8* 31.1*  MCV 96.2 101.6* 100.6* 101.6*  PLT 211 226 209 200   Cardiac Enzymes: No results for input(s): CKTOTAL, CKMB, CKMBINDEX, TROPONINI in the last 168 hours. BNP: Invalid input(s): POCBNP CBG: No results for input(s): GLUCAP in the last 168 hours. D-Dimer No results for input(s): DDIMER in the last 72 hours. Hgb A1c No results for input(s): HGBA1C in the last 72 hours. Lipid Profile No results for input(s): CHOL, HDL, LDLCALC, TRIG, CHOLHDL, LDLDIRECT in the last 72 hours. Thyroid function studies No results for input(s): TSH, T4TOTAL, T3FREE, THYROIDAB in the last 72 hours.  Invalid input(s): FREET3 Anemia work up No results for input(s): VITAMINB12, FOLATE, FERRITIN, TIBC, IRON, RETICCTPCT in the last 72 hours. Urinalysis    Component Value Date/Time   COLORURINE YELLOW 06/18/2024 2245   APPEARANCEUR CLEAR 06/18/2024 2245   LABSPEC 1.024 06/18/2024 2245   PHURINE 5.0 06/18/2024 2245   GLUCOSEU 150 (A) 06/18/2024 2245   HGBUR SMALL (A) 06/18/2024 2245   BILIRUBINUR NEGATIVE 06/18/2024 2245   KETONESUR 5 (A) 06/18/2024 2245   PROTEINUR NEGATIVE 06/18/2024 2245   NITRITE NEGATIVE 06/18/2024 2245   LEUKOCYTESUR NEGATIVE 06/18/2024 2245   Sepsis Labs Recent Labs  Lab 06/18/24 1534 06/19/24 0330 06/20/24 0338 06/21/24 0735  WBC 12.4* 9.7 10.2 9.6   Microbiology Recent Results (from the past 240 hours)  Surgical pcr screen     Status: None   Collection Time: 06/19/24 11:30 AM   Specimen: Nasal Mucosa;  Nasal Swab  Result Value Ref Range Status   MRSA, PCR NEGATIVE NEGATIVE Final   Staphylococcus aureus NEGATIVE NEGATIVE Final    Comment: (NOTE) The Xpert SA Assay (FDA approved for NASAL specimens in patients 31 years of age and older), is one component of a comprehensive surveillance program. It is not intended to diagnose infection nor to guide or monitor treatment. Performed at [facility], [address] [place], [place] [address]     Please note: You  were cared for by a hospitalist during your hospital stay. Once you are discharged, your primary care physician will handle any further medical issues. Please note that NO REFILLS for any discharge medications will be authorized once you are discharged, as it is imperative that you return to your primary care physician (or establish a relationship with a primary care physician if you do not have one) for your post hospital discharge needs so that they can reassess your need for medications and monitor your lab values.    Time coordinating discharge: 40 minutes  SIGNED:   Ivonne Mustache, MD  [facility] 06/22/2024, 12:00 PM Pager [phone]  If 7PM-7AM, please contact night-coverage [email or web address] Password [email or web address]

## 2024-06-22 NOTE — Progress Notes (Signed)
 Physical Therapy Treatment Patient Details Name: April Tanner MRN: [id number] DOB: 07-31-1941 Today's Date: 06/22/2024   History of Present Illness Patient is 82 y.o. female s/p Rt IMN on 06/19/24 due to fall resulting in Rt femoral neck fx. PMH significant for HTN, possible dementia, Lt IMN in 2021.    PT Comments  POD # 3  AxO x 2 pleasant Lady able to recall falling event.  I saw a dead kitten from my kitchen window and I normally use a walker but didn't this time.  I was carring a towel to scope up the kitten so it wouldn't get more run over, but I fell before I could get there.  Pt  unable to recall her WBing status and unable to recall her prior PT visit.  I havn't been out of bed in four days, stated Pt.  Pt was admitted 3 days ago and was evaled 2 days ago. Assisted OOB was difficult.  General bed mobility comments: required increased time and Max Assist using bed pad to complete scooting.  Once upright, Pt was able to static sit with Supervision.  Reported 7/10 pain.  General transfer comment: from elevated bed and Max VC's Pt was able to rise at [facility] Assist briefly. General Gait Details: Pt Educated on PWBing.  Pt was unable to take any functional steps or weright shift due to 10/10 R hip pain.  Assisted from bed to recliner with a few side steps to complete 1/4 turn.  RN called to room for pain meds. Positioned in recliner to comfort.  Applied ICE R hip. Pt lives home alone and was Indep (Daughter assisted with showers/house work/groceries/transportation) Pt will need ST Rehab at SNF to address mobility and functional decline prior to safely returning home.    If plan is discharge home, recommend the following: Two people to help with walking and/or transfers;Two people to help with bathing/dressing/bathroom;Assistance with feeding;Assistance with cooking/housework;Direct supervision/assist for medications management;Assist for transportation;Help with stairs or ramp for  entrance;Supervision due to cognitive status   Can travel by private vehicle     No  Equipment Recommendations  None recommended by PT    Recommendations for Other Services       Precautions / Restrictions Precautions Precautions: Fall Restrictions Weight Bearing Restrictions Per Provider Order: Yes RLE Weight Bearing Per Provider Order: Partial weight bearing RLE Partial Weight Bearing Percentage or Pounds: 50%     Mobility  Bed Mobility Overal bed mobility: Needs Assistance Bed Mobility: Supine to Sit     Supine to sit: Max assist, HOB elevated, Used rails, +2 for physical assistance, +2 for safety/equipment, Total assist     General bed mobility comments: required increased time and Max Assist using bed pad to complete scooting.  Once upright, Pt was able to static sit with Supervision.  Reported 7/10 pain.    Transfers Overall transfer level: Needs assistance Equipment used: Rolling walker (2 wheels) Transfers: Sit to/from Stand Sit to Stand: Mod assist           General transfer comment: from elevated bed and Max VC's Pt was able to rise at [facility] Assist briefly.    Ambulation/Gait Ambulation/Gait assistance: Max assist   Assistive device: Rolling walker (2 wheels) Gait Pattern/deviations: Step-to pattern Gait velocity: decreased     General Gait Details: Pt Educated on PWBing.  Pt was unable to take any functional steps or weright shift due to 10/10 R hip pain.  Assisted from bed to recliner with a few side  steps to complete 1/4 turn.  RN called to room for pain meds.   Stairs             Wheelchair Mobility     Tilt Bed    Modified Rankin (Stroke Patients Only)       Balance                                            Communication Communication Communication: Impaired  Cognition Arousal: Alert Behavior During Therapy: WFL for tasks assessed/performed   PT - Cognitive impairments: History of cognitive  impairments                       PT - Cognition Comments: AxO x 2 pleasant Lady able to recall falling event.  I saw a dead kitten from my kitchen window and I normally use a walker but didn't this time.  I was carring a towel to scope up the kitten so it wouldn't get more run over, but I fell before I could get there.  Pt  unable to recall her WBing status and unable to recall her prior PT visit.  I havn't been out of bed in four days, stated Pt.  Pt was admitted 3 days ago and was evaled 2 days ago. Following commands: Intact Following commands impaired: Follows multi-step commands with increased time    Cueing Cueing Techniques: Verbal cues, Gestural cues  Exercises      General Comments        Pertinent Vitals/Pain Pain Assessment Pain Assessment: 0-10 Pain Score: 10-Worst pain ever Pain Location: R hip with attempted amb Pain Descriptors / Indicators: Aching, Discomfort, Guarding, Grimacing, Operative site guarding Pain Intervention(s): Monitored during session, Repositioned, Patient requesting pain meds-RN notified, Ice applied    Home Living                          Prior Function            PT Goals (current goals can now be found in the care plan section) Progress towards PT goals: Progressing toward goals    Frequency    Min 3X/week      PT Plan      Co-evaluation              AM-PAC PT 6 Clicks Mobility   Outcome Measure  Help needed turning from your back to your side while in a flat bed without using bedrails?: A Lot Help needed moving from lying on your back to sitting on the side of a flat bed without using bedrails?: A Lot Help needed moving to and from a bed to a chair (including a wheelchair)?: A Lot Help needed standing up from a chair using your arms (e.g., wheelchair or bedside chair)?: A Lot Help needed to walk in hospital room?: A Lot Help needed climbing 3-5 steps with a railing? : Total 6 Click Score:  11    End of Session Equipment Utilized During Treatment: Gait belt Activity Tolerance: Patient limited by pain Patient left: in chair;with call bell/phone within reach Nurse Communication: Mobility status PT Visit Diagnosis: Other abnormalities of gait and mobility (R26.89);Muscle weakness (generalized) (M62.81);Difficulty in walking, not elsewhere classified (R26.2);Other symptoms and signs involving the nervous system (R29.898)     Time: 8954-8894 PT Time Calculation (min) (ACUTE ONLY):  20 min  Charges:    $Therapeutic Activity: 8-22 mins PT General Charges $$ ACUTE PT VISIT: 1 Visit                     {Cynai Skeens  PTA Acute  Rehabilitation Services Office M-F          [phone]

## 2024-11-14 ENCOUNTER — Other Ambulatory Visit
